# Patient Record
Sex: Female | Born: 2000 | Race: White | Hispanic: No | Marital: Single | Smoking: Current every day smoker
Health system: Southern US, Community
[De-identification: ages and names within clinical notes are randomized; demographics above are authoritative.]

## PROBLEM LIST (undated history)

## (undated) ENCOUNTER — Inpatient Hospital Stay (HOSPITAL_COMMUNITY): Payer: Self-pay

## (undated) DIAGNOSIS — L309 Dermatitis, unspecified: Secondary | ICD-10-CM

## (undated) DIAGNOSIS — J302 Other seasonal allergic rhinitis: Secondary | ICD-10-CM

## (undated) DIAGNOSIS — J45909 Unspecified asthma, uncomplicated: Secondary | ICD-10-CM

## (undated) HISTORY — PX: CHOLECYSTECTOMY: SHX55

## (undated) HISTORY — PX: TONSILLECTOMY: SUR1361

---
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
## 2019-06-07 ENCOUNTER — Telehealth: Payer: Self-pay | Admitting: Obstetrics & Gynecology

## 2019-06-07 NOTE — Telephone Encounter (Signed)
Michele Vasquez referring for female infertility. Called and left voicemail for patient to call back to be schedule

## 2019-06-09 NOTE — Telephone Encounter (Signed)
Called and left voice mail for patient to call back to be schedule °

## 2019-06-13 NOTE — Telephone Encounter (Signed)
Attempted to reach patient to schedule appointment. Called number on file some one picked up when I asked to speak when patient , the person on the line female hung up.

## 2019-09-15 ENCOUNTER — Ambulatory Visit: Payer: No Typology Code available for payment source

## 2019-09-21 ENCOUNTER — Ambulatory Visit: Payer: No Typology Code available for payment source

## 2019-10-16 ENCOUNTER — Encounter: Payer: Self-pay | Admitting: Emergency Medicine

## 2019-10-16 ENCOUNTER — Emergency Department
Admission: EM | Admit: 2019-10-16 | Discharge: 2019-10-16 | Disposition: A | Discharge status: Home / Self Care | Payer: No Typology Code available for payment source | Source: Home / Self Care | Attending: Student | Admitting: Student

## 2019-10-16 ENCOUNTER — Other Ambulatory Visit: Payer: Self-pay

## 2019-10-16 ENCOUNTER — Encounter: Payer: Self-pay | Admitting: *Deleted

## 2019-10-16 ENCOUNTER — Emergency Department: Payer: No Typology Code available for payment source

## 2019-10-16 ENCOUNTER — Emergency Department
Admission: EM | Admit: 2019-10-16 | Discharge: 2019-10-16 | Disposition: A | Discharge status: Home / Self Care | Payer: No Typology Code available for payment source | Attending: Emergency Medicine | Admitting: Emergency Medicine

## 2019-10-16 DIAGNOSIS — R062 Wheezing: Secondary | ICD-10-CM

## 2019-10-16 DIAGNOSIS — F17210 Nicotine dependence, cigarettes, uncomplicated: Secondary | ICD-10-CM | POA: Insufficient documentation

## 2019-10-16 DIAGNOSIS — R05 Cough: Secondary | ICD-10-CM | POA: Insufficient documentation

## 2019-10-16 DIAGNOSIS — Z5321 Procedure and treatment not carried out due to patient leaving prior to being seen by health care provider: Secondary | ICD-10-CM | POA: Diagnosis not present

## 2019-10-16 DIAGNOSIS — Z79899 Other long term (current) drug therapy: Secondary | ICD-10-CM | POA: Insufficient documentation

## 2019-10-16 LAB — CBC WITH DIFFERENTIAL/PLATELET
Abs Immature Granulocytes: 0.04 10*3/uL (ref 0.00–0.07)
Basophils Absolute: 0.1 10*3/uL (ref 0.0–0.1)
Basophils Relative: 0 %
Eosinophils Absolute: 0.7 10*3/uL — ABNORMAL HIGH (ref 0.0–0.5)
Eosinophils Relative: 6 %
HCT: 39.1 % (ref 36.0–46.0)
Hemoglobin: 11.7 g/dL — ABNORMAL LOW (ref 12.0–15.0)
Immature Granulocytes: 0 %
Lymphocytes Relative: 27 %
Lymphs Abs: 3.2 10*3/uL (ref 0.7–4.0)
MCH: 18.8 pg — ABNORMAL LOW (ref 26.0–34.0)
MCHC: 29.9 g/dL — ABNORMAL LOW (ref 30.0–36.0)
MCV: 62.8 fL — ABNORMAL LOW (ref 80.0–100.0)
Monocytes Absolute: 0.8 10*3/uL (ref 0.1–1.0)
Monocytes Relative: 7 %
Neutro Abs: 7 10*3/uL (ref 1.7–7.7)
Neutrophils Relative %: 60 %
Platelets: 284 10*3/uL (ref 150–400)
RBC: 6.23 MIL/uL — ABNORMAL HIGH (ref 3.87–5.11)
RDW: 18.2 % — ABNORMAL HIGH (ref 11.5–15.5)
WBC: 11.8 10*3/uL — ABNORMAL HIGH (ref 4.0–10.5)
nRBC: 0 % (ref 0.0–0.2)

## 2019-10-16 LAB — BASIC METABOLIC PANEL
Anion gap: 10 (ref 5–15)
BUN: 11 mg/dL (ref 6–20)
CO2: 24 mmol/L (ref 22–32)
Calcium: 8.9 mg/dL (ref 8.9–10.3)
Chloride: 106 mmol/L (ref 98–111)
Creatinine, Ser: 0.78 mg/dL (ref 0.44–1.00)
GFR calc Af Amer: >60 mL/min (ref >60)
GFR calc non Af Amer: >60 mL/min (ref >60)
Glucose, Bld: 72 mg/dL (ref 70–99)
Potassium: 4 mmol/L (ref 3.5–5.1)
Sodium: 140 mmol/L (ref 135–145)

## 2019-10-16 MED ORDER — ALBUTEROL SULFATE HFA 108 (90 BASE) MCG/ACT IN AERS: 2.0000 | INHALATION_SPRAY | Freq: Four times a day (QID) | RESPIRATORY_TRACT | 0 refills | Status: DC | PRN

## 2019-10-16 MED ORDER — ALBUTEROL SULFATE (2.5 MG/3ML) 0.083% IN NEBU: 5.0000 mg | INHALATION_SOLUTION | Freq: Once | RESPIRATORY_TRACT | Status: DC

## 2019-10-16 MED ORDER — IPRATROPIUM-ALBUTEROL 0.5-2.5 (3) MG/3ML IN SOLN
3.0000 mL | Freq: Once | RESPIRATORY_TRACT | Status: AC
  Administered 2019-10-16: 3 mL via RESPIRATORY_TRACT
  Filled 2019-10-16: qty 3

## 2019-10-16 MED ORDER — PREDNISONE 50 MG PO TABS: ORAL_TABLET | ORAL | 0 refills | Status: DC

## 2019-10-16 NOTE — ED Triage Notes (Signed)
Pt to ED via POV for wheezing. Pt states that she has been having an issue with her asthma, has been using inhaler and nebulizer more often. Pt states that she came in early this morning but had to leave before being seen. Pt states that she had blood work and a "scan" done. Pt is in NAD.

## 2019-10-16 NOTE — ED Provider Notes (Signed)
Emergency Department Provider Note  ____________________________________________  Time seen: Approximately 5:57 PM  I have reviewed the triage vital signs and the nursing notes.   HISTORY  Chief Complaint Wheezing   Historian Patient     HPI Michele Vasquez is a 19 y.o. female presents to the emergency department with diffuse wheezing for the past 1 to 2 days.  Patient reports a history of asthma but states that she has been out of her albuterol inhaler.  She has experienced some chest tightness and occasional shortness of breath.  She reports nonproductive cough since Thursday.  She denies rhinorrhea, nasal congestion, fever, emesis or diarrhea.  She reports that her current symptoms feel similar to asthma exacerbations in the past.  No other alleviating measures have been attempted.   Past Medical History:  Diagnosis Date  . Asthma   . Eczema   . Seasonal allergies      Immunizations up to date:  Yes.     Past Medical History:  Diagnosis Date  . Asthma   . Eczema   . Seasonal allergies     There are no problems to display for this patient.   Past Surgical History:  Procedure Laterality Date  . CHOLECYSTECTOMY    . TONSILLECTOMY      Prior to Admission medications   Medication Sig Start Date End Date Taking? Authorizing Provider  albuterol (VENTOLIN HFA) 108 (90 Base) MCG/ACT inhaler Inhale 2 puffs into the lungs every 6 (six) hours as needed for wheezing or shortness of breath. 10/16/19   Lannie Fields, PA-C  esomeprazole (NEXIUM) 40 MG capsule Take 1 capsule (40 mg total) by mouth daily. 05/08/15 05/07/16  Nena Polio, MD  loratadine (CLARITIN) 10 MG tablet Take 10 mg by mouth daily.    [provider]  montelukast (SINGULAIR) 10 MG tablet Take 10 mg by mouth at bedtime.    [provider]  predniSONE (DELTASONE) 50 MG tablet Take one tablet daily for the next five days. 10/16/19   Lannie Fields, PA-C     Allergies Patient has no known allergies.  No family history on file.  Social History Social History   Tobacco Use  . Smoking status: Current Every Day Smoker    Packs/day: 1.00    Types: Cigarettes  . Smokeless tobacco: Never Used  Substance Use Topics  . Alcohol use: Yes    Comment: soc  . Drug use: Yes    Types: Marijuana     Review of Systems  Constitutional: No fever/chills Eyes:  No discharge ENT: No upper respiratory complaints. Respiratory: Patient has wheezing. no cough. No SOB/ use of accessory muscles to breath Gastrointestinal:   No nausea, no vomiting.  No diarrhea.  No constipation. Musculoskeletal: Negative for musculoskeletal pain. Skin: Negative for rash, abrasions, lacerations, ecchymosis.   ____________________________________________   PHYSICAL EXAM:  VITAL SIGNS: ED Triage Vitals  Enc Vitals Group     BP 10/16/19 1515 120/79     Pulse Rate 10/16/19 1513 97     Resp 10/16/19 1513 18     Temp 10/16/19 1513 98.3 F (36.8 C)     Temp Source 10/16/19 1513 Oral     SpO2 10/16/19 1513 95 %     Weight --      Height --      Head Circumference --      Peak Flow --      Pain Score 10/16/19 1514 0     Pain Loc --  Pain Edu? --      Excl. in GC? --      Constitutional: Alert and oriented. Well appearing and in no acute distress. Eyes: Conjunctivae are normal. PERRL. EOMI. Head: Atraumatic. ENT:      Ears: TMs are pearly.       Nose: No congestion/rhinnorhea.      Mouth/Throat: Mucous membranes are moist.  Neck: No stridor.  No cervical spine tenderness to palpation.  Cardiovascular: Normal rate, regular rhythm. Normal S1 and S2.  Good peripheral circulation. Respiratory: Normal respiratory effort without tachypnea or retractions.  Patient initially had diffuse wheezing on exam which improved after DuoNeb.  Good air entry to the bases with no decreased or absent breath sounds Gastrointestinal: Bowel sounds x 4 quadrants. Soft and  nontender to palpation. No guarding or rigidity. No distention. Musculoskeletal: Full range of motion to all extremities. No obvious deformities noted Neurologic:  Normal for age. No gross focal neurologic deficits are appreciated.  Skin:  Skin is warm, dry and intact. No rash noted. Psychiatric: Mood and affect are normal for age. Speech and behavior are normal.   ____________________________________________   LABS (all labs ordered are listed, but only abnormal results are displayed)  Labs Reviewed - No data to display ____________________________________________  EKG   ____________________________________________  RADIOLOGY Geraldo Pitter, personally viewed and evaluated these images (plain radiographs) as part of my medical decision making, as well as reviewing the written report by the radiologist.  DG Chest 2 View  Result Date: 10/16/2019 CLINICAL DATA:  Shortness of breath, sore throat, sinus drainage EXAM: CHEST - 2 VIEW COMPARISON:  Radiograph 10/14/2018 FINDINGS: No consolidation, features of edema, pneumothorax, or effusion. Pulmonary vascularity is normally distributed. The cardiomediastinal contours are unremarkable. No acute osseous or soft tissue abnormality. IMPRESSION: No acute cardiopulmonary abnormality. Electronically Signed   By: Kreg Shropshire M.D.   On: 10/16/2019 03:29    ____________________________________________    PROCEDURES  Procedure(s) performed:     Procedures     Medications  ipratropium-albuterol (DUONEB) 0.5-2.5 (3) MG/3ML nebulizer solution 3 mL (3 mLs Nebulization Given 10/16/19 1658)     ____________________________________________   INITIAL IMPRESSION / ASSESSMENT AND PLAN / ED COURSE  Pertinent labs & imaging results that were available during my care of the patient were reviewed by me and considered in my medical decision making (see chart for details).      Assessment and plan Wheezing 19 year old female presents to  the emergency department with diffuse wheezing for the past 1 to 2 days.  Patient was tachypneic but vital signs were otherwise reassuring.  On physical exam, patient had wheezing auscultated bilaterally with good breath sounds in the bases.  Wheezing improved significantly after DuoNeb was administered.  Patient was started on prednisone in the emergency department and she was discharged with both prednisone and albuterol inhaler.  Return precautions were given to return with new or worsening symptoms.  All patient questions were answered.    ____________________________________________  FINAL CLINICAL IMPRESSION(S) / ED DIAGNOSES  Final diagnoses:  Wheezing      NEW MEDICATIONS STARTED DURING THIS VISIT:  ED Discharge Orders         Ordered    albuterol (VENTOLIN HFA) 108 (90 Base) MCG/ACT inhaler  Every 6 hours PRN     10/16/19 1737    predniSONE (DELTASONE) 50 MG tablet     10/16/19 1737              This chart was  dictated using voice recognition software/Dragon. Despite best efforts to proofread, errors can occur which can change the meaning. Any change was purely unintentional.     Orvil Feil, PA-C 10/16/19 1803    Miguel Aschoff., MD 10/17/19 [phone]

## 2019-10-16 NOTE — ED Triage Notes (Signed)
Pt presents w/ c/o shortness of breath and cough. Pt c/o sore throat. Pt has sinus drainage. Pt states albuterol nebs and MDI's are not helping w/ cough and dyspnea.

## 2019-10-16 NOTE — ED Notes (Signed)
Pt completed nebulizer at this time- pt on phone speaking without difficulty. Wheezing decreased from initial assessment.

## 2019-10-16 NOTE — ED Notes (Signed)
Pt c/o asthma for last three days, states she ran out of rescue inhaler and used nebulizer at home- last treatment was around 1 pm without improvement. Pt c/o nasal/chest congestion but denies any sputum production. Pt has inspiratory and expiratory wheezes throughout her lung fields, wet sounding cough noted. Pt aox4, NAD noted.

## 2019-11-25 ENCOUNTER — Ambulatory Visit: Payer: No Typology Code available for payment source

## 2020-01-16 ENCOUNTER — Ambulatory Visit: Payer: No Typology Code available for payment source

## 2020-04-30 ENCOUNTER — Other Ambulatory Visit: Payer: Self-pay

## 2020-04-30 ENCOUNTER — Encounter (HOSPITAL_COMMUNITY): Payer: Self-pay | Admitting: Emergency Medicine

## 2020-04-30 ENCOUNTER — Ambulatory Visit (HOSPITAL_COMMUNITY)
Admission: EM | Admit: 2020-04-30 | Discharge: 2020-04-30 | Disposition: A | Discharge status: Home / Self Care | Payer: Self-pay | Attending: Emergency Medicine | Admitting: Emergency Medicine

## 2020-04-30 DIAGNOSIS — Z3202 Encounter for pregnancy test, result negative: Secondary | ICD-10-CM

## 2020-04-30 DIAGNOSIS — Z8249 Family history of ischemic heart disease and other diseases of the circulatory system: Secondary | ICD-10-CM | POA: Insufficient documentation

## 2020-04-30 DIAGNOSIS — R197 Diarrhea, unspecified: Secondary | ICD-10-CM

## 2020-04-30 DIAGNOSIS — R112 Nausea with vomiting, unspecified: Secondary | ICD-10-CM | POA: Insufficient documentation

## 2020-04-30 DIAGNOSIS — R1110 Vomiting, unspecified: Secondary | ICD-10-CM

## 2020-04-30 DIAGNOSIS — J45909 Unspecified asthma, uncomplicated: Secondary | ICD-10-CM | POA: Insufficient documentation

## 2020-04-30 DIAGNOSIS — Z20822 Contact with and (suspected) exposure to covid-19: Secondary | ICD-10-CM | POA: Insufficient documentation

## 2020-04-30 DIAGNOSIS — Z79899 Other long term (current) drug therapy: Secondary | ICD-10-CM | POA: Insufficient documentation

## 2020-04-30 DIAGNOSIS — R1013 Epigastric pain: Secondary | ICD-10-CM | POA: Insufficient documentation

## 2020-04-30 DIAGNOSIS — F17210 Nicotine dependence, cigarettes, uncomplicated: Secondary | ICD-10-CM | POA: Insufficient documentation

## 2020-04-30 LAB — CBC
HCT: 39.6 % (ref 36.0–46.0)
Hemoglobin: 11.8 g/dL — ABNORMAL LOW (ref 12.0–15.0)
MCH: 18.7 pg — ABNORMAL LOW (ref 26.0–34.0)
MCHC: 29.8 g/dL — ABNORMAL LOW (ref 30.0–36.0)
MCV: 62.8 fL — ABNORMAL LOW (ref 80.0–100.0)
Platelets: 267 10*3/uL (ref 150–400)
RBC: 6.31 MIL/uL — ABNORMAL HIGH (ref 3.87–5.11)
RDW: 18.4 % — ABNORMAL HIGH (ref 11.5–15.5)
WBC: 11.4 10*3/uL — ABNORMAL HIGH (ref 4.0–10.5)
nRBC: 0 % (ref 0.0–0.2)

## 2020-04-30 LAB — COMPREHENSIVE METABOLIC PANEL
ALT: 12 U/L (ref 0–44)
AST: 15 U/L (ref 15–41)
Albumin: 4.2 g/dL (ref 3.5–5.0)
Alkaline Phosphatase: 54 U/L (ref 38–126)
Anion gap: 10 (ref 5–15)
BUN: 12 mg/dL (ref 6–20)
CO2: 22 mmol/L (ref 22–32)
Calcium: 9 mg/dL (ref 8.9–10.3)
Chloride: 107 mmol/L (ref 98–111)
Creatinine, Ser: 0.71 mg/dL (ref 0.44–1.00)
GFR calc Af Amer: >60 mL/min (ref >60)
GFR calc non Af Amer: >60 mL/min (ref >60)
Glucose, Bld: 85 mg/dL (ref 70–99)
Potassium: 4.1 mmol/L (ref 3.5–5.1)
Sodium: 139 mmol/L (ref 135–145)
Total Bilirubin: 0.6 mg/dL (ref 0.3–1.2)
Total Protein: 7.2 g/dL (ref 6.5–8.1)

## 2020-04-30 LAB — POCT URINALYSIS DIPSTICK, ED / UC
Bilirubin Urine: NEGATIVE
Glucose, UA: NEGATIVE mg/dL
Leukocytes,Ua: NEGATIVE
Nitrite: NEGATIVE
Protein, ur: NEGATIVE mg/dL
Specific Gravity, Urine: 1.025 (ref 1.005–1.030)
Urobilinogen, UA: 0.2 mg/dL (ref 0.0–1.0)
pH: 5 (ref 5.0–8.0)

## 2020-04-30 LAB — LIPASE, BLOOD: Lipase: 27 U/L (ref 11–51)

## 2020-04-30 LAB — POC URINE PREG, ED: Preg Test, Ur: NEGATIVE

## 2020-04-30 LAB — SARS CORONAVIRUS 2 (TAT 6-24 HRS): SARS Coronavirus 2: NEGATIVE

## 2020-04-30 MED ORDER — OMEPRAZOLE 20 MG PO CPDR: 20.0000 mg | DELAYED_RELEASE_CAPSULE | Freq: Two times a day (BID) | ORAL | 0 refills | Status: DC

## 2020-04-30 MED ORDER — ONDANSETRON 4 MG PO TBDP: 4.0000 mg | ORAL_TABLET | Freq: Three times a day (TID) | ORAL | 0 refills | Status: DC | PRN

## 2020-04-30 MED ORDER — FAMOTIDINE 20 MG PO TABS: 20.0000 mg | ORAL_TABLET | Freq: Two times a day (BID) | ORAL | 0 refills | Status: DC

## 2020-04-30 MED ORDER — DICYCLOMINE HCL 20 MG PO TABS: 20.0000 mg | ORAL_TABLET | Freq: Three times a day (TID) | ORAL | 0 refills | Status: DC

## 2020-04-30 NOTE — ED Triage Notes (Signed)
Pt c/o emesis and diarrhea x 1 week. She states she had her gallbladder removed in 2015. She states she is vomiting yellow or clear but no food. She states this is how she felt before she had her gallbladder removed.

## 2020-04-30 NOTE — Discharge Instructions (Addendum)
Urine normal, without signs of infection Blood work pending-I will only call if this is abnormal Begin omeprazole twice daily for the next 2 weeks Pepcid twice daily as needed for more immediate relief Zofran as needed for nausea Bentyl before meals and bedtime as needed for cramping Drink plenty of fluids Please follow-up if any symptoms not improving or worsening

## 2020-04-30 NOTE — ED Provider Notes (Signed)
[facility]    CSN: [id number] Arrival date & time: 04/30/20  2947      History   Chief Complaint Chief Complaint  Patient presents with  . Emesis  . Diarrhea    HPI Michele Vasquez is a 19 y.o. female history of prior cholecystectomy presenting today for evaluation of abdominal pain and vomiting.  Patient reports over the past week she has had nausea and vomiting especially in the morning.  She reports a burning sensation to her upper abdomen.  After vomiting she notices a cramping sensation in her lower abdomen.  Vomiting has been clear/mucousy.  She has had associated headaches.  Diarrhea has been present over the past 3 days.  She started her menstrual cycle 3 days ago ago as well.  She is not on any birth control.  Denies significant alcohol use.  Denies NSAID use, does occasionally take Tylenol PM.  Denies other GI problems.  Denies close sick contacts.  Denies URI symptoms.    HPI  Past Medical History:  Diagnosis Date  . Asthma   . Eczema   . Seasonal allergies     There are no problems to display for this patient.   Past Surgical History:  Procedure Laterality Date  . CHOLECYSTECTOMY    . TONSILLECTOMY      OB History   No obstetric history on file.      Home Medications    Prior to Admission medications   Medication Sig Start Date End Date Taking? Authorizing Provider  albuterol (VENTOLIN HFA) 108 (90 Base) MCG/ACT inhaler Inhale 2 puffs into the lungs every 6 (six) hours as needed for wheezing or shortness of breath. 10/16/19   Orvil Feil, PA-C  dicyclomine (BENTYL) 20 MG tablet Take 1 tablet (20 mg total) by mouth 4 (four) times daily -  before meals and at bedtime. 04/30/20   Marcus Groll C, PA-C  famotidine (PEPCID) 20 MG tablet Take 1 tablet (20 mg total) by mouth 2 (two) times daily. 04/30/20   Anatasia Tino C, PA-C  omeprazole (PRILOSEC) 20 MG capsule Take 1 capsule (20 mg total) by mouth 2 (two) times daily before a  meal for 15 days. 04/30/20 05/15/20  Divon Krabill C, PA-C  ondansetron (ZOFRAN ODT) 4 MG disintegrating tablet Take 1 tablet (4 mg total) by mouth every 8 (eight) hours as needed for nausea or vomiting. 04/30/20   Arraya Buck C, PA-C  esomeprazole (NEXIUM) 40 MG capsule Take 1 capsule (40 mg total) by mouth daily. 05/08/15 04/30/20  Arnaldo Natal, MD  loratadine (CLARITIN) 10 MG tablet Take 10 mg by mouth daily.  04/30/20  [provider]  montelukast (SINGULAIR) 10 MG tablet Take 10 mg by mouth at bedtime.  04/30/20  [provider]    Family History Family History  Problem Relation Age of Onset  . Hypertension Mother   . Migraines Mother   . Asthma Father   . Heart disease Father   . Hypertension Father   . Breast cancer Maternal Grandmother   . Colon cancer Maternal Grandmother   . Stomach cancer Maternal Grandmother   . Diabetes Maternal Grandmother     Social History Social History   Tobacco Use  . Smoking status: Current Every Day Smoker    Packs/day: 1.00    Types: Cigarettes  . Smokeless tobacco: Never Used  Substance Use Topics  . Alcohol use: Yes    Comment: soc  . Drug use: Yes  Types: Marijuana     Allergies   Patient has no known allergies.   Review of Systems Review of Systems  Constitutional: Negative for activity change, appetite change, chills, fatigue and fever.  HENT: Negative for congestion, ear pain, rhinorrhea, sinus pressure, sore throat and trouble swallowing.   Eyes: Negative for discharge and redness.  Respiratory: Negative for cough, chest tightness and shortness of breath.   Cardiovascular: Negative for chest pain.  Gastrointestinal: Positive for diarrhea, nausea and vomiting. Negative for abdominal pain.  Genitourinary: Negative for dysuria, frequency and urgency.  Musculoskeletal: Negative for myalgias.  Skin: Negative for rash.  Neurological: Negative for dizziness, light-headedness and headaches.      Physical Exam Triage Vital Signs ED Triage Vitals  Enc Vitals Group     BP      Pulse      Resp      Temp      Temp src      SpO2      Weight      Height      Head Circumference      Peak Flow      Pain Score      Pain Loc      Pain Edu?      Excl. in GC?    No data found.  Updated Vital Signs BP 113/68 (BP Location: Left Arm)   Pulse 74   Temp 98.7 F (37.1 C) (Oral)   Resp 16   LMP 04/28/2020   SpO2 100%   Visual Acuity Right Eye Distance:   Left Eye Distance:   Bilateral Distance:    Right Eye Near:   Left Eye Near:    Bilateral Near:     Physical Exam Vitals and nursing note reviewed.  Constitutional:      Appearance: She is well-developed.     Comments: No acute distress  HENT:     Head: Normocephalic and atraumatic.     Ears:     Comments: Bilateral ears without tenderness to palpation of external auricle, tragus and mastoid, EAC's without erythema or swelling, TM's with good bony landmarks and cone of light. Non erythematous.     Nose: Nose normal.     Mouth/Throat:     Comments: Oral mucosa pink and moist, no tonsillar enlargement or exudate. Posterior pharynx patent and nonerythematous, no uvula deviation or swelling. Normal phonation. Eyes:     Conjunctiva/sclera: Conjunctivae normal.  Cardiovascular:     Rate and Rhythm: Normal rate.  Pulmonary:     Effort: Pulmonary effort is normal. No respiratory distress.     Comments: Breathing comfortably at rest, CTABL, no wheezing, rales or other adventitious sounds auscultated Abdominal:     General: There is no distension.     Comments: Mild tenderness to right upper quadrant and epigastrium, bilateral lower quadrants without tenderness negative rebound, negative Rovsing, negative McBurney's  Musculoskeletal:        General: Normal range of motion.     Cervical back: Neck supple.  Skin:    General: Skin is warm and dry.  Neurological:     Mental Status: She is alert and oriented to  person, place, and time.      UC Treatments / Results  Labs (all labs ordered are listed, but only abnormal results are displayed) Labs Reviewed  POCT URINALYSIS DIPSTICK, ED / UC - Abnormal; Notable for the following components:      Result Value   Ketones, ur TRACE (*)  Hgb urine dipstick LARGE (*)    All other components within normal limits  SARS CORONAVIRUS 2 (TAT 6-24 HRS)  CBC  COMPREHENSIVE METABOLIC PANEL  LIPASE, BLOOD  POC URINE PREG, ED    EKG   Radiology No results found.  Procedures Procedures (including critical care time)  Medications Ordered in UC Medications - No data to display  Initial Impression / Assessment and Plan / UC Course  I have reviewed the triage vital signs and the nursing notes.  Pertinent labs & imaging results that were available during my care of the patient were reviewed by me and considered in my medical decision making (see chart for details).    COVID PCR pending Pregnancy test negative, UA not suggestive of UTI, symptoms and history my suggestive of likely underlying gastritis/GERD, has had prior cholecystectomy.  Initiating on PPI x2 weeks, Pepcid to supplement as needed, Zofran for nausea, Bentyl for cramping.  If cramping related to menstrual cycle may use NSAIDs.  Blood work pending of CBC, CMP and lipase, will call if abnormal/altering plan.  Discussed strict return precautions. Patient verbalized understanding and is agreeable with plan.  Final Clinical Impressions(s) / UC Diagnoses   Final diagnoses:  Non-intractable vomiting with nausea, unspecified vomiting type  Epigastric pain     Discharge Instructions     Urine normal, without signs of infection Blood work pending-I will only call if this is abnormal Begin omeprazole twice daily for the next 2 weeks Pepcid twice daily as needed for more immediate relief Zofran as needed for nausea Bentyl before meals and bedtime as needed for cramping Drink plenty of  fluids Please follow-up if any symptoms not improving or worsening    ED Prescriptions    Medication Sig Dispense Auth. Provider   omeprazole (PRILOSEC) 20 MG capsule Take 1 capsule (20 mg total) by mouth 2 (two) times daily before a meal for 15 days. 30 capsule Adhya Cocco C, PA-C   ondansetron (ZOFRAN ODT) 4 MG disintegrating tablet Take 1 tablet (4 mg total) by mouth every 8 (eight) hours as needed for nausea or vomiting. 20 tablet Britania Shreeve C, PA-C   famotidine (PEPCID) 20 MG tablet Take 1 tablet (20 mg total) by mouth 2 (two) times daily. 30 tablet Atticus Lemberger C, PA-C   dicyclomine (BENTYL) 20 MG tablet Take 1 tablet (20 mg total) by mouth 4 (four) times daily -  before meals and at bedtime. 20 tablet Nyoka Alcoser, Schulter C, PA-C     PDMP not reviewed this encounter.   Cordelle Dahmen, [place] C, PA-C 04/30/20 0930

## 2020-06-05 ENCOUNTER — Encounter (HOSPITAL_COMMUNITY): Payer: Self-pay | Admitting: Emergency Medicine

## 2020-06-05 ENCOUNTER — Other Ambulatory Visit: Payer: Self-pay

## 2020-06-05 ENCOUNTER — Ambulatory Visit (HOSPITAL_COMMUNITY)
Admission: EM | Admit: 2020-06-05 | Discharge: 2020-06-05 | Disposition: A | Discharge status: Home / Self Care | Payer: Self-pay | Attending: Family Medicine | Admitting: Family Medicine

## 2020-06-05 DIAGNOSIS — N390 Urinary tract infection, site not specified: Secondary | ICD-10-CM

## 2020-06-05 DIAGNOSIS — R300 Dysuria: Secondary | ICD-10-CM

## 2020-06-05 LAB — POCT URINALYSIS DIPSTICK, ED / UC
Bilirubin Urine: NEGATIVE
Glucose, UA: NEGATIVE mg/dL
Hgb urine dipstick: NEGATIVE
Ketones, ur: NEGATIVE mg/dL
Leukocytes,Ua: NEGATIVE
Nitrite: NEGATIVE
Protein, ur: NEGATIVE mg/dL
Specific Gravity, Urine: 1.025 (ref 1.005–1.030)
Urobilinogen, UA: 0.2 mg/dL (ref 0.0–1.0)
pH: 7 (ref 5.0–8.0)

## 2020-06-05 NOTE — Discharge Instructions (Addendum)
We have sent testing for sexually transmitted infections. We will notify you of any positive results once they are received. If required, we will prescribe any medications you might need.  Please refrain from all sexual activity for at least the next seven days.  

## 2020-06-05 NOTE — ED Triage Notes (Signed)
Reports burning with urination and dark urine for 4 days.  Has been taking azo.  Patient reports her partner is positive for chlamydia , gonorrhea.  Patient requesting testing

## 2020-06-06 NOTE — ED Provider Notes (Signed)
[facility]   [id number] 06/05/20 Arrival Time: 1509  ASSESSMENT & PLAN:  1. Dysuria       Discharge Instructions     We have sent testing for sexually transmitted infections. We will notify you of any positive results once they are received. If required, we will prescribe any medications you might need.  Please refrain from all sexual activity for at least the next seven days.     Without s/s of PID.  Labs Reviewed  POCT URINALYSIS DIPSTICK, ED / UC  CERVICOVAGINAL ANCILLARY ONLY    Will notify of any positive results. Instructed to refrain from sexual activity for at least seven days.  Reviewed expectations re: course of current medical issues. Questions answered. Outlined signs and symptoms indicating need for more acute intervention. Patient verbalized understanding. After Visit Summary given.   SUBJECTIVE:  Michele Vasquez is a 19 y.o. female who requests STD screening. No symptoms. Treated for chlamydia and gonorrhea in the near past. Feeling well. One female sexual partner.  Patient's last menstrual period was 05/28/2020.   OBJECTIVE:  Vitals:   06/05/20 1559  BP: 108/77  Pulse: 94  Resp: 18  Temp: 98 F (36.7 C)  TempSrc: Oral  SpO2: 96%     General appearance: alert, cooperative, appears stated age and no distress Lungs: unlabored respirations; speaks full sentences without difficulty Back: no CVA tenderness; FROM at waist Abdomen: soft, non-tender GU: deferred Skin: warm and dry Psychological: alert and cooperative; normal mood and affect.  Results for orders placed or performed during the hospital encounter of 06/05/20  POC Urinalysis dipstick  Result Value Ref Range   Glucose, UA NEGATIVE NEGATIVE mg/dL   Bilirubin Urine NEGATIVE NEGATIVE   Ketones, ur NEGATIVE NEGATIVE mg/dL   Specific Gravity, Urine 1.025 1.005 - 1.030   Hgb urine dipstick NEGATIVE NEGATIVE   pH 7.0 5.0 - 8.0   Protein, ur NEGATIVE NEGATIVE  mg/dL   Urobilinogen, UA 0.2 0.0 - 1.0 mg/dL   Nitrite NEGATIVE NEGATIVE   Leukocytes,Ua NEGATIVE NEGATIVE    Labs Reviewed  POCT URINALYSIS DIPSTICK, ED / UC  CERVICOVAGINAL ANCILLARY ONLY    No Known Allergies  Past Medical History:  Diagnosis Date  . Asthma   . Eczema   . Seasonal allergies    Family History  Problem Relation Age of Onset  . Hypertension Mother   . Migraines Mother   . Asthma Father   . Heart disease Father   . Hypertension Father   . Breast cancer Maternal Grandmother   . Colon cancer Maternal Grandmother   . Stomach cancer Maternal Grandmother   . Diabetes Maternal Grandmother    Social History   Socioeconomic History  . Marital status: Single    Spouse name: Not on file  . Number of children: Not on file  . Years of education: Not on file  . Highest education level: Not on file  Occupational History  . Not on file  Tobacco Use  . Smoking status: Current Every Day Smoker    Packs/day: 1.00    Types: Cigarettes  . Smokeless tobacco: Never Used  Substance and Sexual Activity  . Alcohol use: Yes    Comment: soc  . Drug use: Yes    Types: Marijuana  . Sexual activity: Not on file  Other Topics Concern  . Not on file  Social History Narrative  . Not on file   Social Determinants of Health   Financial Resource Strain:   .  Difficulty of Paying Living Expenses: Not on file  Food Insecurity:   . Worried About Programme researcher, broadcasting/film/video in the Last Year: Not on file  . Ran Out of Food in the Last Year: Not on file  Transportation Needs:   . Lack of Transportation (Medical): Not on file  . Lack of Transportation (Non-Medical): Not on file  Physical Activity:   . Days of Exercise per Week: Not on file  . Minutes of Exercise per Session: Not on file  Stress:   . Feeling of Stress : Not on file  Social Connections:   . Frequency of Communication with Friends and Family: Not on file  . Frequency of Social Gatherings with Friends and Family:  Not on file  . Attends Religious Services: Not on file  . Active Member of Clubs or Organizations: Not on file  . Attends Banker Meetings: Not on file  . Marital Status: Not on file  Intimate Partner Violence:   . Fear of Current or Ex-Partner: Not on file  . Emotionally Abused: Not on file  . Physically Abused: Not on file  . Sexually Abused: Not on file          Mardella Layman, MD 06/06/20 1416

## 2020-06-08 ENCOUNTER — Telehealth (HOSPITAL_COMMUNITY): Payer: Self-pay | Admitting: Emergency Medicine

## 2020-06-08 LAB — CERVICOVAGINAL ANCILLARY ONLY
Bacterial Vaginitis (gardnerella): POSITIVE — AB
Chlamydia: NEGATIVE
Comment: NEGATIVE
Comment: NEGATIVE
Comment: NEGATIVE
Comment: NORMAL
Neisseria Gonorrhea: NEGATIVE
Trichomonas: NEGATIVE

## 2020-06-08 MED ORDER — METRONIDAZOLE 500 MG PO TABS: 500.0000 mg | ORAL_TABLET | Freq: Two times a day (BID) | ORAL | 0 refills | Status: DC

## 2020-10-02 ENCOUNTER — Telehealth: Payer: Medicaid Other | Admitting: Physician Assistant

## 2020-10-02 ENCOUNTER — Ambulatory Visit: Payer: Self-pay

## 2020-10-02 DIAGNOSIS — J45901 Unspecified asthma with (acute) exacerbation: Secondary | ICD-10-CM

## 2020-10-02 MED ORDER — PREDNISONE 50 MG PO TABS: 50.0000 mg | ORAL_TABLET | Freq: Every day | ORAL | 0 refills | Status: AC

## 2020-10-02 NOTE — Progress Notes (Signed)
Virtual Visit via Video Note  Michele Vasquez,you are scheduled for a virtual visit with your provider today.    Just as we do with appointments in the office, we must obtain your consent to participate.  Your consent will be active for this visit and any virtual visit you may have with one of our providers in the next 365 days.    If you have a MyChart account, I can also send a copy of this consent to you electronically.  All virtual visits are billed to your insurance company just like a traditional visit in the office.  As this is a virtual visit, video technology does not allow for your provider to perform a traditional examination.  This may limit your provider's ability to fully assess your condition.  If your provider identifies any concerns that need to be evaluated in person or the need to arrange testing such as labs, EKG, etc, we will make arrangements to do so.    Although advances in technology are sophisticated, we cannot ensure that it will always work on either your end or our end.  If the connection with a video visit is poor, we may have to switch to a telephone visit.  With either a video or telephone visit, we are not always able to ensure that we have a secure connection.   I need to obtain your verbal consent now.   Are you willing to proceed with your visit today?   Michele Vasquez has provided verbal consent on 10/02/2020 for a virtual visit (video or telephone).   Michele Vasquez Manfred Shirts, PA-C 10/02/2020  4:58 PM     I connected with Michele Vasquez on 10/02/20 at  4:30 PM EST by a video enabled telemedicine application and verified that I am speaking with the correct person using two identifiers.  Location: Patient: home Provider: home   I discussed the limitations of evaluation and management by telemedicine and the availability of in person appointments. The patient expressed understanding and agreed to proceed.  History of Present Illness:  Pt is a 20  y/o F with a h/o asthma who presents ti the ED today for evaluation of an asthma exacerbation. She states that since this morning she has been feeling short of breath with chest tightness. She has had many exacerbations in that past and this feels similar. Sxs usually flair up when the weather changes. She usually takes advair, claritin and montelukast however she has not been able to afford these medications due to her insurance and has only been taking her albuterol inhaler. Her inhaler has helped some and has allowed her to do her daily activities however she feels that she needs a prescription for steroids. She has been admitted for asthma in the past but states her symptoms today are not as severe as when she has required admission in the past. She denies fevers, rhinorrhea, congestion, sore throat. States she had COVID last year and is also fully vaccinated.   ROS:  As noted in HPI.   Observations/Objective:  Physical Exam Constitutional:      General: She is not in acute distress. HENT:     Head: Atraumatic.  Pulmonary:     Comments: Speaking in full sentences without obvious tachypnea. Ambulatory without obvious dyspnea during visit. No auditory wheezing or accessory muscle use. Musculoskeletal:     Cervical back: Neck supple.  Neurological:     Mental Status: She is alert.  Psychiatric:  Mood and Affect: Mood normal.      Assessment and Plan:  1. Likely has asthma exacerbation due to not filling her home medications. Does not appear to be in any acute distress on video evaluation. Given hx of admissions for asthma exacerbations I recommended patient be seen in person likely in the ED, however will also give Rx for prednisone as she is reticent to go to the ER at this time. She has an appointment later today to be seen at urgent care. She is agreeable to go to the ER if her symptoms worsen  Follow Up Instructions:   Advised pt to keep f/u appt today and to be see in the ED  if sxs worsen or persist. I discussed the assessment and treatment plan with the patient. The patient was provided an opportunity to ask questions and all were answered. The patient agreed with the plan and demonstrated an understanding of the instructions.   The patient was advised to call back or seek an in-person evaluation if the symptoms worsen or if the condition fails to improve as anticipated.  I provided 10 minutes of non-face-to-face time during this encounter.   Michele Vasquez Manfred Shirts, PA-C

## 2020-10-02 NOTE — Progress Notes (Signed)
Based on what you shared with me, I feel your condition warrants further evaluation and I recommend that you be seen for a face to face office visit.  I am the provider who just completed a video visit with you. As discussed, I feel that you will need to be seen in person to check your oxygen levels and have a physical exam. Since you have already completed a video visit you will not be charged for this visit as well.    NOTE: If you entered your credit card information for this eVisit, you will not be charged. You may see a "hold" on your card for the $35 but that hold will drop off and you will not have a charge processed.   If you are having a true medical emergency please call 911.      For an urgent face to face visit, [place] has five urgent care centers for your convenience:     [facility] at [facility] Directions [phone] [address] [place], [place] [address] . 10 am - 6pm Monday - Friday    [facility]) Get Driving Directions [phone] [address] [place], [place] [address] . 10 am to 8 pm Monday-Friday . 12 pm to 8 pm [facility] at [facility] Get Driving Directions [phone] [address], [address] [place], [place] [address] . 8 am to 8 pm Monday-Friday . 9 am to 6 pm Saturday . 11 am to 6 pm Sunday     [facility] at [facility] Get Driving Directions  [phone] [address] [place], [place] [address] . 8 am to 8 pm Monday-Friday . 8 am to 4 pm [facility] Urgent Care at [facility] Directions [phone] [address] [place], [place] [address] . 12 pm to 6 pm Monday-Friday      Your e-visit answers were reviewed by a board certified advanced clinical practitioner to complete your personal care plan.  Thank you for using e-Visits.    Approximately 5 minutes was spent documenting and reviewing patient's chart.

## 2020-10-02 NOTE — Patient Instructions (Signed)
http://www.aaaai.org/conditions-and-treatments/asthma">  Asthma, Adult  Asthma is a long-term (chronic) condition that causes recurrent episodes in which the airways become tight and narrow. The airways are the passages that lead from the nose and mouth down into the lungs. Asthma episodes, also called asthma attacks, can cause coughing, wheezing, shortness of breath, and chest pain. The airways can also fill with mucus. During an attack, it can be difficult to breathe. Asthma attacks can range from minor to life threatening. Asthma cannot be cured, but medicines and lifestyle changes can help control it and treat acute attacks. What are the causes? This condition is believed to be caused by inherited (genetic) and environmental factors, but its exact cause is not known. There are many things that can bring on an asthma attack or make asthma symptoms worse (triggers). Asthma triggers are different for each person. Common triggers include:  Mold.  Dust.  Cigarette smoke.  Cockroaches.  Things that can cause allergy symptoms (allergens), such as animal dander or pollen from trees or grass.  Air pollutants such as household cleaners, wood smoke, smog, or chemical odors.  Cold air, weather changes, and winds (which increase molds and pollen in the air).  Strong emotional expressions such as crying or laughing hard.  Stress.  Certain medicines (such as aspirin) or types of medicines (such as beta-blockers).  Sulfites in foods and drinks. Foods and drinks that may contain sulfites include dried fruit, potato chips, and sparkling grape juice.  Infections or inflammatory conditions such as the flu, a cold, or inflammation of the nasal membranes (rhinitis).  Gastroesophageal reflux disease (GERD).  Exercise or strenuous activity. What are the signs or symptoms? Symptoms of this condition may occur right after asthma is triggered or many hours later. Symptoms include:  Wheezing. This can  sound like whistling when you breathe.  Excessive nighttime or early morning coughing.  Frequent or severe coughing with a common cold.  Chest tightness.  Shortness of breath.  Tiredness (fatigue) with minimal activity. How is this diagnosed? This condition is diagnosed based on:  Your medical history.  A physical exam.  Tests, which may include: ? Lung function studies and pulmonary studies (spirometry). These tests can evaluate the flow of air in your lungs. ? Allergy tests. ? Imaging tests, such as X-rays. How is this treated? There is no cure for this condition, but treatment can help control your symptoms. Treatment for asthma usually involves:  Identifying and avoiding your asthma triggers.  Using medicines to control your symptoms. Generally, two types of medicines are used to treat asthma: ? Controller medicines. These help prevent asthma symptoms from occurring. They are usually taken every day. ? Fast-acting reliever or rescue medicines. These quickly relieve asthma symptoms by widening the narrow and tight airways. They are used as needed and provide short-term relief.  Using supplemental oxygen. This may be needed during a severe episode.  Using other medicines, such as: ? Allergy medicines, such as antihistamines, if your asthma attacks are triggered by allergens. ? Immune medicines (immunomodulators). These are medicines that help control the immune system.  Creating an asthma action plan. An asthma action plan is a written plan for managing and treating your asthma attacks. This plan includes: ? A list of your asthma triggers and how to avoid them. ? Information about when medicines should be taken and when their dosage should be changed. ? Instructions about using a device called a peak flow meter. A peak flow meter measures how well the lungs are working   and the severity of your asthma. It helps you monitor your condition. Follow these instructions at  home: Controlling your home environment Control your home environment in the following ways to help avoid triggers and prevent asthma attacks:  Change your heating and air conditioning filter regularly.  Limit your use of fireplaces and wood stoves.  Get rid of pests (such as roaches and mice) and their droppings.  Throw away plants if you see mold on them.  Clean floors and dust surfaces regularly. Use unscented cleaning products.  Try to have someone else vacuum for you regularly. Stay out of rooms while they are being vacuumed and for a short while afterward. If you vacuum, use a dust mask from a hardware store, a double-layered or microfilter vacuum cleaner bag, or a vacuum cleaner with a HEPA filter.  Replace carpet with wood, tile, or vinyl flooring. Carpet can trap dander and dust.  Use allergy-proof pillows, mattress covers, and box spring covers.  Keep your bedroom a trigger-free room.  Avoid pets and keep windows closed when allergens are in the air.  Wash beddings every week in hot water and dry them in a dryer.  Use blankets that are made of polyester or cotton.  Clean bathrooms and kitchens with bleach. If possible, have someone repaint the walls in these rooms with mold-resistant paint. Stay out of the rooms that are being cleaned and painted.  Wash your hands often with soap and water. If soap and water are not available, use hand sanitizer.  Do not allow anyone to smoke in your home. General instructions  Take over-the-counter and prescription medicines only as told by your health care provider. ? Speak with your health care provider if you have questions about how or when to take the medicines. ? Make note if you are requiring more frequent dosages.  Do not use any products that contain nicotine or tobacco, such as cigarettes and e-cigarettes. If you need help quitting, ask your health care provider. Also, avoid being exposed to secondhand smoke.  Use a peak  flow meter as told by your health care provider. Record and keep track of the readings.  Understand and use the asthma action plan to help minimize, or stop an asthma attack, without needing to seek medical care.  Make sure you stay up to date on your yearly vaccinations as told by your health care provider. This may include vaccines for the flu and pneumonia.  Avoid outdoor activities when allergen counts are high and when air quality is low.  Wear a ski mask that covers your nose and mouth during outdoor winter activities. Exercise indoors on cold days if you can.  Warm up before exercising, and take time for a cool-down period after exercise.  Keep all follow-up visits as told by your health care provider. This is important. Where to find more information  For information about asthma, turn to the Centers for Disease Control and Prevention at www.cdc.gov/asthma/faqs  For air quality information, turn to AirNow at airnow.gov Contact a health care provider if:  You have wheezing, shortness of breath, or a cough even while you are taking medicine to prevent attacks.  The mucus you cough up (sputum) is thicker than usual.  Your sputum changes from clear or white to yellow, green, gray, or bloody.  Your medicines are causing side effects, such as a rash, itching, swelling, or trouble breathing.  You need to use a reliever medicine more than 2-3 times a week.  Your peak   flow reading is still at 50-79% of your personal best after following your action plan for 1 hour.  You have a fever. Get help right away if:  You are getting worse and do not respond to treatment during an asthma attack.  You are short of breath when at rest or when doing very little physical activity.  You have difficulty eating, drinking, or talking.  You have chest pain or tightness.  You develop a fast heartbeat or palpitations.  You have a bluish color to your lips or fingernails.  You are  light-headed or dizzy, or you faint.  Your peak flow reading is less than 50% of your personal best.  You feel too tired to breathe normally. Summary  Asthma is a long-term (chronic) condition that causes recurrent episodes in which the airways become tight and narrow. These episodes can cause coughing, wheezing, shortness of breath, and chest pain.  Asthma cannot be cured, but medicines and lifestyle changes can help control it and treat acute attacks.  Make sure you understand how to avoid triggers and how and when to use your medicines.  Asthma attacks can range from minor to life threatening. Get help right away if you have an asthma attack and do not respond to treatment with your usual rescue medicines. This information is not intended to replace advice given to you by your health care provider. Make sure you discuss any questions you have with your health care provider. Document Revised: 04/20/2020 Document Reviewed: 11/23/2019 Elsevier Patient Education  2021 [employer].  

## 2020-10-17 ENCOUNTER — Encounter (HOSPITAL_COMMUNITY): Payer: Self-pay

## 2020-10-17 ENCOUNTER — Ambulatory Visit (HOSPITAL_COMMUNITY)
Admission: EM | Admit: 2020-10-17 | Discharge: 2020-10-17 | Disposition: A | Discharge status: Home / Self Care | Payer: Self-pay | Attending: Medical Oncology | Admitting: Medical Oncology

## 2020-10-17 ENCOUNTER — Other Ambulatory Visit: Payer: Self-pay

## 2020-10-17 ENCOUNTER — Ambulatory Visit (INDEPENDENT_AMBULATORY_CARE_PROVIDER_SITE_OTHER): Payer: Self-pay

## 2020-10-17 DIAGNOSIS — R062 Wheezing: Secondary | ICD-10-CM

## 2020-10-17 DIAGNOSIS — R059 Cough, unspecified: Secondary | ICD-10-CM

## 2020-10-17 MED ORDER — ALBUTEROL SULFATE HFA 108 (90 BASE) MCG/ACT IN AERS: 1.0000 | INHALATION_SPRAY | Freq: Four times a day (QID) | RESPIRATORY_TRACT | 0 refills | Status: DC | PRN

## 2020-10-17 MED ORDER — LORATADINE 10 MG PO TABS: 10.0000 mg | ORAL_TABLET | Freq: Every day | ORAL | 0 refills | Status: AC

## 2020-10-17 MED ORDER — MONTELUKAST SODIUM 10 MG PO TABS: 10.0000 mg | ORAL_TABLET | Freq: Every day | ORAL | 0 refills | Status: DC

## 2020-10-17 MED ORDER — FLUTICASONE PROPIONATE 50 MCG/ACT NA SUSP: 2.0000 | Freq: Every day | NASAL | 0 refills | Status: DC

## 2020-10-17 NOTE — ED Provider Notes (Signed)
[facility]    CSN: [id number] Arrival date & time: 10/17/20  1027      History   Chief Complaint Chief Complaint  Patient presents with  . Cough  . Dizziness  . Fatigue    HPI Michele Vasquez is a 20 y.o. female.   HPI   Cough: Patient states that for the past week she has had a productive cough, cough, wheezing and headache.  She states that she is getting cold symptoms off and on for the past few months.  She worries that there may be something bigger at hand as she keeps getting sick.  She has not taken anything for symptoms and admits that she is now off of her allergy and asthma medications due to losing her insurance.  No recent fevers, chest pain, shortness of breath or dizziness.  She reports that she did take a breathing treatment prior to coming to the office today and her symptoms are greatly improved.  Past Medical History:  Diagnosis Date  . Asthma   . Eczema   . Seasonal allergies     There are no problems to display for this patient.   Past Surgical History:  Procedure Laterality Date  . CHOLECYSTECTOMY    . TONSILLECTOMY      OB History   No obstetric history on file.      Home Medications    Prior to Admission medications   Medication Sig Start Date End Date Taking? Authorizing Provider  albuterol (VENTOLIN HFA) 108 (90 Base) MCG/ACT inhaler Inhale 1-2 puffs into the lungs every 6 (six) hours as needed for wheezing or shortness of breath. 10/17/20  Yes Lema Heinkel M, PA-C  fluticasone [facility]) 50 MCG/ACT nasal spray Place 2 sprays into both nostrils daily. 10/17/20  Yes Jhovanny Guinta M, PA-C  loratadine (CLARITIN) 10 MG tablet Take 1 tablet (10 mg total) by mouth daily. 10/17/20  Yes Izack Hoogland M, PA-C  montelukast (SINGULAIR) 10 MG tablet Take 1 tablet (10 mg total) by mouth at bedtime. 10/17/20  Yes Scotty Weigelt, Maralyn Sago M, PA-C  AZO-CRANBERRY PO Take by mouth.    [provider]  metroNIDAZOLE (FLAGYL)  500 MG tablet Take 1 tablet (500 mg total) by mouth 2 (two) times daily. 06/08/20   Lamptey, Britta Mccreedy, MD  dicyclomine (BENTYL) 20 MG tablet Take 1 tablet (20 mg total) by mouth 4 (four) times daily -  before meals and at bedtime. 04/30/20 06/05/20  Wieters, Hallie C, PA-C  esomeprazole (NEXIUM) 40 MG capsule Take 1 capsule (40 mg total) by mouth daily. 05/08/15 04/30/20  Arnaldo Natal, MD  famotidine (PEPCID) 20 MG tablet Take 1 tablet (20 mg total) by mouth 2 (two) times daily. 04/30/20 06/05/20  Wieters, Hallie C, PA-C  omeprazole (PRILOSEC) 20 MG capsule Take 1 capsule (20 mg total) by mouth 2 (two) times daily before a meal for 15 days. 04/30/20 06/05/20  Wieters, Junius Creamer, PA-C    Family History Family History  Problem Relation Age of Onset  . Hypertension Mother   . Migraines Mother   . Asthma Father   . Heart disease Father   . Hypertension Father   . Breast cancer Maternal Grandmother   . Colon cancer Maternal Grandmother   . Stomach cancer Maternal Grandmother   . Diabetes Maternal Grandmother     Social History Social History   Tobacco Use  . Smoking status: Current Every Day Smoker    Packs/day: 1.00    Types: Cigarettes  .  Smokeless tobacco: Never Used  Substance Use Topics  . Alcohol use: Yes    Comment: soc  . Drug use: Yes    Types: Marijuana     Allergies   Cat hair extract   Review of Systems Review of Systems  As stated above in HPI Physical Exam Triage Vital Signs ED Triage Vitals  Enc Vitals Group     BP 10/17/20 1043 122/80     Pulse Rate 10/17/20 1043 94     Resp 10/17/20 1043 19     Temp 10/17/20 1043 98.3 F (36.8 C)     Temp src --      SpO2 10/17/20 1043 98 %     Weight --      Height --      Head Circumference --      Peak Flow --      Pain Score 10/17/20 1041 0     Pain Loc --      Pain Edu? --      Excl. in GC? --    No data found.  Updated Vital Signs BP 122/80   Pulse 94   Temp 98.3 F (36.8 C)   Resp 19   LMP  10/16/2020 (Exact Date)   SpO2 98%   Physical Exam Vitals and nursing note reviewed.  Constitutional:      General: She is not in acute distress.    Appearance: Normal appearance. She is obese. She is not ill-appearing, toxic-appearing or diaphoretic.  HENT:     Head: Normocephalic and atraumatic.     Right Ear: Tympanic membrane, ear canal and external ear normal.     Left Ear: Tympanic membrane, ear canal and external ear normal.     Nose: Congestion and rhinorrhea present.     Mouth/Throat:     Mouth: Mucous membranes are moist.     Pharynx: No oropharyngeal exudate or posterior oropharyngeal erythema.  Eyes:     Extraocular Movements: Extraocular movements intact.     Pupils: Pupils are equal, round, and reactive to light.  Cardiovascular:     Rate and Rhythm: Normal rate and regular rhythm.     Heart sounds: Normal heart sounds.  Pulmonary:     Effort: Pulmonary effort is normal. No respiratory distress.     Breath sounds: Normal breath sounds. No stridor. No wheezing, rhonchi or rales.  Chest:     Chest wall: No tenderness.  Musculoskeletal:     Cervical back: Normal range of motion and neck supple.  Lymphadenopathy:     Cervical: No cervical adenopathy.  Skin:    General: Skin is warm.     Capillary Refill: Capillary refill takes less than 2 seconds.     Coloration: Skin is not jaundiced or pale.  Neurological:     General: No focal deficit present.     Mental Status: She is alert and oriented to person, place, and time.     Motor: No weakness.      UC Treatments / Results  Labs (all labs ordered are listed, but only abnormal results are displayed) Labs Reviewed - No data to display  EKG   Radiology DG Chest 2 View  Result Date: 10/17/2020 CLINICAL DATA:  Productive cough EXAM: CHEST - 2 VIEW COMPARISON:  10/16/2019 FINDINGS: The heart size and mediastinal contours are within normal limits. Both lungs are clear. The visualized skeletal structures are  unremarkable. IMPRESSION: No active cardiopulmonary disease. Electronically Signed   By: Marlan Palau M.D.  On: 10/17/2020 11:02    Procedures Procedures (including critical care time)  Medications Ordered in UC Medications - No data to display  Initial Impression / Assessment and Plan / UC Course  I have reviewed the triage vital signs and the nursing notes.  Pertinent labs & imaging results that were available during my care of the patient were reviewed by me and considered in my medical decision making (see chart for details).     New.  I believe that her stopping her allergy and asthma medication has a lot to do with her symptoms.  I discussed this with patient.  Restarting her on her medications and encouraging her to set up with a primary care provider.  We reviewed her chest x-ray which was normal.  Vitals and exam reassuring today.  Follow-up as needed. Final Clinical Impressions(s) / UC Diagnoses   Final diagnoses:  Cough  Wheezing   Discharge Instructions   None    ED Prescriptions    Medication Sig Dispense Auth. Provider   albuterol (VENTOLIN HFA) 108 (90 Base) MCG/ACT inhaler Inhale 1-2 puffs into the lungs every 6 (six) hours as needed for wheezing or shortness of breath. 18 g Railee Bonillas M, PA-C   montelukast (SINGULAIR) 10 MG tablet Take 1 tablet (10 mg total) by mouth at bedtime. 30 tablet Nahiem Dredge M, PA-C   loratadine (CLARITIN) 10 MG tablet Take 1 tablet (10 mg total) by mouth daily. 30 tablet Oceana Walthall M, PA-C   fluticasone [facility]) 50 MCG/ACT nasal spray Place 2 sprays into both nostrils daily. 16 g Kimbree Casanas M, [place]     PDMP not reviewed this encounter.   Rushie Chestnut, [place] 10/17/20 1143

## 2020-10-17 NOTE — ED Triage Notes (Signed)
Pt in with c/o productive coughing, dizziness, headache, loss of appetite that has been going on for about 1 week now. Also c/o expectorating excessive amounts of green mucous   Pt has been using her inhaler and breathing treatment with minimal relief  Also has been taking robitussin

## 2021-12-02 ENCOUNTER — Ambulatory Visit
Admission: RE | Admit: 2021-12-02 | Discharge: 2021-12-02 | Disposition: A | Discharge status: Home / Self Care | Payer: Medicaid Other | Source: Ambulatory Visit | Attending: Internal Medicine | Admitting: Internal Medicine

## 2021-12-02 VITALS — BP 117/73 | HR 106 | Temp 97.3°F | Resp 18

## 2021-12-02 DIAGNOSIS — N898 Other specified noninflammatory disorders of vagina: Secondary | ICD-10-CM | POA: Insufficient documentation

## 2021-12-02 DIAGNOSIS — N390 Urinary tract infection, site not specified: Secondary | ICD-10-CM | POA: Insufficient documentation

## 2021-12-02 DIAGNOSIS — R1032 Left lower quadrant pain: Secondary | ICD-10-CM | POA: Insufficient documentation

## 2021-12-02 DIAGNOSIS — R519 Headache, unspecified: Secondary | ICD-10-CM | POA: Insufficient documentation

## 2021-12-02 DIAGNOSIS — Z113 Encounter for screening for infections with a predominantly sexual mode of transmission: Secondary | ICD-10-CM | POA: Insufficient documentation

## 2021-12-02 LAB — POCT URINALYSIS DIP (MANUAL ENTRY)
Bilirubin, UA: NEGATIVE
Blood, UA: NEGATIVE
Glucose, UA: NEGATIVE mg/dL
Ketones, POC UA: NEGATIVE mg/dL
Nitrite, UA: NEGATIVE
Protein Ur, POC: 30 mg/dL — AB
Spec Grav, UA: >=1.03 — AB (ref 1.010–1.025)
Urobilinogen, UA: 1 E.U./dL
pH, UA: 5.5 (ref 5.0–8.0)

## 2021-12-02 LAB — POCT URINE PREGNANCY: Preg Test, Ur: NEGATIVE

## 2021-12-02 MED ORDER — FLUCONAZOLE 150 MG PO TABS: 150.0000 mg | ORAL_TABLET | Freq: Every day | ORAL | 0 refills | Status: DC

## 2021-12-02 MED ORDER — NITROFURANTOIN MONOHYD MACRO 100 MG PO CAPS: 100.0000 mg | ORAL_CAPSULE | Freq: Two times a day (BID) | ORAL | 0 refills | Status: DC

## 2021-12-02 NOTE — ED Triage Notes (Addendum)
Patient presents to Urgent Care with complaints of LLQ pain since last night. Patient reports pain comes and goes, sharp shooting pain, aggravated by using restroom. Pt reports more frequent bm, and recent ha. Pt also requesting normal check up while here.  ? ?Pt also reports vaginal itching and otc azo. Pt requesting urine dipstick.  ?

## 2021-12-02 NOTE — Discharge Instructions (Addendum)
You have been prescribed antibiotic for suspected urinary tract infection and Diflucan for suspected yeast infection.  Urine culture, vaginal swab, blood work are pending.  We will call if they are positive.  Please refrain from sexual activity until test results and treatment are complete.  Please follow-up with headache clinic at provided contact information today for further evaluation and management of your persistent headaches. ?

## 2021-12-02 NOTE — ED Provider Notes (Signed)
?[facility] ? ? ? ?CSN: [id number] ?Arrival date & time: 12/02/21  1130 ? ? ?  ? ?History   ?Chief Complaint ?Chief Complaint  ?Patient presents with  ? Abdominal Pain  ?  Really bad cramps and sharp pains , plus bad migraines - Entered by patient  ? ? ?HPI ?Michele Vasquez is a 21 y.o. female.  ? ?Patient presents with left lower quadrant abdominal pain, dysuria, vaginal itching that has been present since last night.  Denies vaginal discharge, hematuria, abnormal vaginal bleeding, urinary frequency, back pain, fever.  Denies nausea, vomiting, diarrhea, constipation.  Having regular bowel movements.  Denies blood in stool.  Denies any known exposure to STD but is requesting STD testing including blood work for HIV and syphilis. ? ?Patient also reports that she been having more frequent headaches over the past month.  She reports that she has had a history of similar migraines but has never been formally diagnosed with migraines.  She typically has nausea without vomiting associated with her migraines but denies blurry vision or dizziness.  Headache is present in the right side of the head.  Denies any current headache.  Last headache was a few days prior.  Headaches typically resolve with Excedrin.  She has never seen a specialist for this. ? ? ?Abdominal Pain ? ?Past Medical History:  ?Diagnosis Date  ? Asthma   ? Eczema   ? Seasonal allergies   ? ? ?There are no problems to display for this patient. ? ? ?Past Surgical History:  ?Procedure Laterality Date  ? CHOLECYSTECTOMY    ? TONSILLECTOMY    ? ? ?OB History   ?No obstetric history on file. ?  ? ? ? ?Home Medications   ? ?Prior to Admission medications   ?Medication Sig Start Date End Date Taking? Authorizing Provider  ?fluconazole (DIFLUCAN) 150 MG tablet Take 1 tablet (150 mg total) by mouth daily. 12/02/21  Yes Areal Cochrane, Acie Fredrickson, FNP  ?nitrofurantoin, macrocrystal-monohydrate, (MACROBID) 100 MG capsule Take 1 capsule (100 mg total) by mouth 2  (two) times daily. 12/02/21  Yes Andra Matsuo, Acie Fredrickson, FNP  ?albuterol (VENTOLIN HFA) 108 (90 Base) MCG/ACT inhaler Inhale 1-2 puffs into the lungs every 6 (six) hours as needed for wheezing or shortness of breath. 10/17/20   Rushie Chestnut, PA-C  ?AZO-CRANBERRY PO Take by mouth.    [provider]  ?fluticasone (FLONASE) 50 MCG/ACT nasal spray Place 2 sprays into both nostrils daily. 10/17/20   Rushie Chestnut, PA-C  ?loratadine (CLARITIN) 10 MG tablet Take 1 tablet (10 mg total) by mouth daily. 10/17/20   Rushie Chestnut, PA-C  ?metroNIDAZOLE (FLAGYL) 500 MG tablet Take 1 tablet (500 mg total) by mouth 2 (two) times daily. 06/08/20   LampteyBritta Mccreedy, MD  ?montelukast (SINGULAIR) 10 MG tablet Take 1 tablet (10 mg total) by mouth at bedtime. 10/17/20   Rushie Chestnut, PA-C  ?dicyclomine (BENTYL) 20 MG tablet Take 1 tablet (20 mg total) by mouth 4 (four) times daily -  before meals and at bedtime. 04/30/20 06/05/20  Wieters, Hallie C, PA-C  ?esomeprazole (NEXIUM) 40 MG capsule Take 1 capsule (40 mg total) by mouth daily. 05/08/15 04/30/20  Arnaldo Natal, MD  ?famotidine (PEPCID) 20 MG tablet Take 1 tablet (20 mg total) by mouth 2 (two) times daily. 04/30/20 06/05/20  Wieters, Hallie C, PA-C  ?omeprazole (PRILOSEC) 20 MG capsule Take 1 capsule (20 mg total) by mouth 2 (two) times daily before a  meal for 15 days. 04/30/20 06/05/20  Wieters, Hallie C, PA-C  ? ? ?Family History ?Family History  ?Problem Relation Age of Onset  ? Hypertension Mother   ? Migraines Mother   ? Asthma Father   ? Heart disease Father   ? Hypertension Father   ? Breast cancer Maternal Grandmother   ? Colon cancer Maternal Grandmother   ? Stomach cancer Maternal Grandmother   ? Diabetes Maternal Grandmother   ? ? ?Social History ?Social History  ? ?Tobacco Use  ? Smoking status: Every Day  ?  Packs/day: 0.50  ?  Types: Cigarettes  ? Smokeless tobacco: Never  ?Substance Use Topics  ? Alcohol use: Yes  ?  Comment: soc  ? Drug use: Not  Currently  ?  Types: Marijuana  ? ? ? ?Allergies   ?Cat hair extract ? ? ?Review of Systems ?Review of Systems ?Per HPI ? ?Physical Exam ?Triage Vital Signs ?ED Triage Vitals  ?Enc Vitals Group  ?   BP 12/02/21 1209 117/73  ?   Pulse Rate 12/02/21 1158 (!) 106  ?   Resp 12/02/21 1158 18  ?   Temp 12/02/21 1158 (!) 97.3 ?F (36.3 ?C)  ?   Temp src --   ?   SpO2 12/02/21 1158 99 %  ?   Weight --   ?   Height --   ?   Head Circumference --   ?   Peak Flow --   ?   Pain Score 12/02/21 1156 7  ?   Pain Loc --   ?   Pain Edu? --   ?   Excl. in GC? --   ? ?No data found. ? ?Updated Vital Signs ?BP 117/73   Pulse (!) 106   Temp (!) 97.3 ?F (36.3 ?C)   Resp 18   LMP 11/16/2021   SpO2 99%  ? ?Visual Acuity ?Right Eye Distance:   ?Left Eye Distance:   ?Bilateral Distance:   ? ?Right Eye Near:   ?Left Eye Near:    ?Bilateral Near:    ? ?Physical Exam ?Constitutional:   ?   General: She is not in acute distress. ?   Appearance: Normal appearance. She is not toxic-appearing or diaphoretic.  ?HENT:  ?   Head: Normocephalic and atraumatic.  ?Eyes:  ?   Extraocular Movements: Extraocular movements intact.  ?   Conjunctiva/sclera: Conjunctivae normal.  ?   Pupils: Pupils are equal, round, and reactive to light.  ?Cardiovascular:  ?   Rate and Rhythm: Normal rate and regular rhythm.  ?   Pulses: Normal pulses.  ?   Heart sounds: Normal heart sounds.  ?Pulmonary:  ?   Effort: Pulmonary effort is normal. No respiratory distress.  ?   Breath sounds: Normal breath sounds.  ?Abdominal:  ?   General: Bowel sounds are normal. There is no distension.  ?   Palpations: Abdomen is soft.  ?   Tenderness: There is no abdominal tenderness.  ?Neurological:  ?   General: No focal deficit present.  ?   Mental Status: She is alert and oriented to person, place, and time. Mental status is at baseline.  ?   Cranial Nerves: Cranial nerves 2-12 are intact.  ?   Sensory: Sensation is intact.  ?   Motor: Motor function is intact.  ?   Coordination:  Coordination is intact.  ?   Gait: Gait is intact.  ?Psychiatric:     ?   Mood  and Affect: Mood normal.     ?   Behavior: Behavior normal.     ?   Thought Content: Thought content normal.     ?   Judgment: Judgment normal.  ? ? ? ?UC Treatments / Results  ?Labs ?(all labs ordered are listed, but only abnormal results are displayed) ?Labs Reviewed  ?POCT URINALYSIS DIP (MANUAL ENTRY) - Abnormal; Notable for the following components:  ?    Result Value  ? Clarity, UA cloudy (*)   ? Spec Grav, UA >=1.030 (*)   ? Protein Ur, POC =30 (*)   ? Leukocytes, UA Trace (*)   ? All other components within normal limits  ?URINE CULTURE  ?RPR  ?HIV ANTIBODY (ROUTINE TESTING W REFLEX)  ?POCT URINE PREGNANCY  ?CERVICOVAGINAL ANCILLARY ONLY  ? ? ?EKG ? ? ?Radiology ?No results found. ? ?Procedures ?Procedures (including critical care time) ? ?Medications Ordered in UC ?Medications - No data to display ? ?Initial Impression / Assessment and Plan / UC Course  ?I have reviewed the triage vital signs and the nursing notes. ? ?Pertinent labs & imaging results that were available during my care of the patient were reviewed by me and considered in my medical decision making (see chart for details). ? ?  ? ?Urinalysis indicating possible urinary tract infection.  Although, I am suspicious of vaginitis related to yeast as well given vaginal itching.  Will treat and cover for both with Macrobid and Diflucan.  Cervicovaginal swab, urine culture, blood work for HIV and syphilis pending.  Will add or change treatment once results are complete.  Patient advised to refrain from sexual activity until test results and treatment are complete. ? ?Patient also having more persistent migraines.  Neuro exam is normal and patient does not have migraine currently.  Advised patient that it would be best for her to follow-up with headache clinic at provided contact information for further evaluation and management.  Patient given strict return and ER  precautions.  Patient verbalized understanding and was agreeable with plan. ?Final Clinical Impressions(s) / UC Diagnoses  ? ?Final diagnoses:  ?Lower urinary tract infection  ?Vaginal itching  ?Abdominal pain, left lower qu

## 2021-12-03 LAB — URINE CULTURE: Culture: NO GROWTH

## 2021-12-03 LAB — CERVICOVAGINAL ANCILLARY ONLY
Bacterial Vaginitis (gardnerella): POSITIVE — AB
Candida Glabrata: NEGATIVE
Candida Vaginitis: NEGATIVE
Chlamydia: NEGATIVE
Comment: NEGATIVE
Comment: NEGATIVE
Comment: NEGATIVE
Comment: NEGATIVE
Comment: NEGATIVE
Comment: NORMAL
Neisseria Gonorrhea: NEGATIVE
Trichomonas: NEGATIVE

## 2021-12-03 LAB — HIV ANTIBODY (ROUTINE TESTING W REFLEX): HIV Screen 4th Generation wRfx: NONREACTIVE

## 2021-12-03 LAB — RPR: RPR Ser Ql: NONREACTIVE

## 2021-12-04 ENCOUNTER — Telehealth: Payer: Self-pay

## 2021-12-04 MED ORDER — METRONIDAZOLE 500 MG PO TABS: 500.0000 mg | ORAL_TABLET | Freq: Two times a day (BID) | ORAL | 0 refills | Status: DC

## 2021-12-04 NOTE — Telephone Encounter (Signed)
Pt tested BV(+) flagyl sent to pharmacy per protocol. Education provided.  ?

## 2022-02-08 ENCOUNTER — Emergency Department (HOSPITAL_COMMUNITY)
Admission: EM | Admit: 2022-02-08 | Discharge: 2022-02-09 | Disposition: A | Discharge status: Home / Self Care | Payer: Self-pay | Attending: Emergency Medicine | Admitting: Emergency Medicine

## 2022-02-08 DIAGNOSIS — J45909 Unspecified asthma, uncomplicated: Secondary | ICD-10-CM | POA: Insufficient documentation

## 2022-02-08 DIAGNOSIS — R519 Headache, unspecified: Secondary | ICD-10-CM

## 2022-02-08 DIAGNOSIS — Z7951 Long term (current) use of inhaled steroids: Secondary | ICD-10-CM | POA: Insufficient documentation

## 2022-02-09 ENCOUNTER — Emergency Department (HOSPITAL_COMMUNITY): Payer: Medicaid Other

## 2022-02-09 ENCOUNTER — Other Ambulatory Visit: Payer: Self-pay

## 2022-02-09 LAB — I-STAT BETA HCG BLOOD, ED (MC, WL, AP ONLY): I-stat hCG, quantitative: <5 m[IU]/mL (ref <5)

## 2022-02-09 MED ORDER — DIPHENHYDRAMINE HCL 50 MG/ML IJ SOLN
12.5000 mg | Freq: Once | INTRAMUSCULAR | Status: AC
  Administered 2022-02-09: 12.5 mg via INTRAVENOUS
  Filled 2022-02-09: qty 1

## 2022-02-09 MED ORDER — LIDOCAINE 5 % EX PTCH
1.0000 | MEDICATED_PATCH | Freq: Every day | CUTANEOUS | 0 refills | Status: AC | PRN
Start: 2022-02-09 — End: ?

## 2022-02-09 MED ORDER — SODIUM CHLORIDE 0.9 % IV BOLUS
1000.0000 mL | Freq: Once | INTRAVENOUS | Status: AC
  Administered 2022-02-09: 1000 mL via INTRAVENOUS

## 2022-02-09 MED ORDER — LIDOCAINE 5 % EX PTCH
2.0000 | MEDICATED_PATCH | CUTANEOUS | Status: DC
  Administered 2022-02-09: 2 via TRANSDERMAL
  Filled 2022-02-09: qty 2

## 2022-02-09 MED ORDER — PROCHLORPERAZINE EDISYLATE 10 MG/2ML IJ SOLN
10.0000 mg | Freq: Once | INTRAMUSCULAR | Status: AC
  Administered 2022-02-09: 10 mg via INTRAVENOUS
  Filled 2022-02-09: qty 2

## 2022-02-09 MED ORDER — NAPROXEN 500 MG PO TABS: 500.0000 mg | ORAL_TABLET | Freq: Two times a day (BID) | ORAL | 0 refills | Status: DC | PRN

## 2022-02-09 NOTE — ED Provider Notes (Signed)
[facility]-EMERGENCY DEPT Provider Note   CSN: [id number] Arrival date & time: 02/08/22  2349     History  Chief Complaint  Patient presents with   Headache    Michele Vasquez is a 21 y.o. female with a hx of asthma & eczema who presents to the ED with complaints of headache for the past 2 weeks.  Headache is mostly to the back of her head but can occur in the temples, it is squeezing/sharp in nature.  Worse with bright lights and loud noises, alleviated somewhat with Goody powder or Excedrin Migraine.  She has had headaches in the past but never 1 that has lasted this long or felt quite like this.  Had gradual onset with steady progression.  She denies fever, chills, change in vision, numbness, tingling, weakness, dizziness, or syncope. Her pcp prescribed her a muscle relaxant to try to help with this, she has not picked this up yet.   HPI     Home Medications Prior to Admission medications   Medication Sig Start Date End Date Taking? Authorizing Provider  albuterol (VENTOLIN HFA) 108 (90 Base) MCG/ACT inhaler Inhale 1-2 puffs into the lungs every 6 (six) hours as needed for wheezing or shortness of breath. 10/17/20   Rushie Chestnut, PA-C  AZO-CRANBERRY PO Take by mouth.    [provider]  fluconazole (DIFLUCAN) 150 MG tablet Take 1 tablet (150 mg total) by mouth daily. 12/02/21   Gustavus Bryant, FNP  fluticasone (FLONASE) 50 MCG/ACT nasal spray Place 2 sprays into both nostrils daily. 10/17/20   Rushie Chestnut, PA-C  loratadine (CLARITIN) 10 MG tablet Take 1 tablet (10 mg total) by mouth daily. 10/17/20   Rushie Chestnut, PA-C  metroNIDAZOLE (FLAGYL) 500 MG tablet Take 1 tablet (500 mg total) by mouth 2 (two) times daily. 12/04/21   Lamptey, Britta Mccreedy, MD  montelukast (SINGULAIR) 10 MG tablet Take 1 tablet (10 mg total) by mouth at bedtime. 10/17/20   Rushie Chestnut, PA-C  nitrofurantoin, macrocrystal-monohydrate, (MACROBID) 100 MG  capsule Take 1 capsule (100 mg total) by mouth 2 (two) times daily. 12/02/21   Gustavus Bryant, FNP  dicyclomine (BENTYL) 20 MG tablet Take 1 tablet (20 mg total) by mouth 4 (four) times daily -  before meals and at bedtime. 04/30/20 06/05/20  Wieters, Hallie C, PA-C  esomeprazole (NEXIUM) 40 MG capsule Take 1 capsule (40 mg total) by mouth daily. 05/08/15 04/30/20  Arnaldo Natal, MD  famotidine (PEPCID) 20 MG tablet Take 1 tablet (20 mg total) by mouth 2 (two) times daily. 04/30/20 06/05/20  Wieters, Hallie C, PA-C  omeprazole (PRILOSEC) 20 MG capsule Take 1 capsule (20 mg total) by mouth 2 (two) times daily before a meal for 15 days. 04/30/20 06/05/20  Wieters, Hallie C, PA-C      Allergies    Cat hair extract    Review of Systems   Review of Systems  Constitutional:  Negative for chills and fever.  Eyes:  Negative for visual disturbance.  Respiratory:  Negative for shortness of breath.   Cardiovascular:  Negative for chest pain.  Gastrointestinal:  Negative for nausea and vomiting.  Neurological:  Positive for headaches. Negative for dizziness, syncope, speech difficulty, weakness and numbness.  All other systems reviewed and are negative.   Physical Exam Updated Vital Signs BP 131/77 (BP Location: Left Arm)   Pulse 88   Temp 98.3 F (36.8 C) (Oral)   Resp 16  Ht 5' (1.524 m)   Wt 97.1 kg   SpO2 100%   BMI 41.79 kg/m  Physical Exam Vitals and nursing note reviewed.  Constitutional:      General: She is not in acute distress.    Appearance: Normal appearance. She is not toxic-appearing.  HENT:     Head: Normocephalic and atraumatic.     Mouth/Throat:     Pharynx: Oropharynx is clear. Uvula midline.  Eyes:     General: Vision grossly intact. Gaze aligned appropriately.     Extraocular Movements: Extraocular movements intact.     Conjunctiva/sclera: Conjunctivae normal.     Pupils: Pupils are equal, round, and reactive to light.     Comments: No proptosis.   Cardiovascular:      Rate and Rhythm: Normal rate and regular rhythm.  Pulmonary:     Effort: Pulmonary effort is normal.     Breath sounds: Normal breath sounds.  Abdominal:     General: There is no distension.     Palpations: Abdomen is soft.     Tenderness: There is no abdominal tenderness. There is no guarding or rebound.  Musculoskeletal:     Cervical back: Normal range of motion and neck supple. No rigidity. Muscular tenderness present.  Skin:    General: Skin is warm and dry.  Neurological:     Mental Status: She is alert.     Comments: Alert. Clear speech. No facial droop. CNIII-XII grossly intact. Bilateral upper and lower extremities' sensation grossly intact. 5/5 symmetric strength with grip strength and with plantar and dorsi flexion bilaterally . Normal finger to nose bilaterally. Negative pronator drift. Gait intact.    Psychiatric:        Mood and Affect: Mood normal.        Behavior: Behavior normal.     ED Results / Procedures / Treatments   Labs (all labs ordered are listed, but only abnormal results are displayed) Labs Reviewed  I-STAT BETA HCG BLOOD, ED ([facility], [facility], [facility] ONLY)    EKG None  Radiology CT Head Wo Contrast  Result Date: 02/09/2022 CLINICAL DATA:  Headache, new or worsening and positional. Migraine headache for 2 weeks EXAM: CT HEAD WITHOUT CONTRAST TECHNIQUE: Contiguous axial images were obtained from the base of the skull through the vertex without intravenous contrast. RADIATION DOSE REDUCTION: This exam was performed according to the departmental dose-optimization program which includes automated exposure control, adjustment of the mA and/or kV according to patient size and/or use of iterative reconstruction technique. COMPARISON:  None Available. FINDINGS: Brain: No evidence of acute infarction, hemorrhage, hydrocephalus, extra-axial collection or mass lesion/mass effect. Vascular: No hyperdense vessel or unexpected calcification. Skull: Normal. Negative for fracture or  focal lesion. Sinuses/Orbits: No acute finding. IMPRESSION: Negative head CT. Electronically Signed   By: Tiburcio Pea M.D.   On: 02/09/2022 07:58    Procedures Procedures    Medications Ordered in ED Medications - No data to display  ED Course/ Medical Decision Making/ A&P                           Medical Decision Making Amount and/or Complexity of Data Reviewed Radiology: ordered.  Risk Prescription drug management.  Patient presents to the ED with complaints of headache, this involves an extensive number of treatment options, and is a complaint that carries with it a high risk of complications and morbidity. Nontoxic, vitals fairly unremarkable. .   Additional history obtained:  Chart/nursing  notes reviewed.   Lab Tests:  I viewed & interpreted labs including:  Pregnancy test: Negative  Imaging Studies:  I ordered and viewed the following imaging, agree with radiologist impression:  CT head wo contrast: negative head CT.   ED Course:  I ordered medications including migraine cocktail (compazine/benadryl) and fluids.   On re-assessment patient woken from sleep, she feels much better.   Patient has hx of similar headaches, gradual onset with steady progression in severity, patient is afebrile with no focal neuro deficits, dizziness, change in vision, proptosis, or nuchal rigidity- low suspicion for [facility], [facility], ischemic CVA, dural venous sinus thrombosis, acute glaucoma, giant cell arteritis, mass, or meningitis. Overall seems reasonable for discharge. I discussed results, treatment plan, need for PCP follow-up, and return precautions with the patient. Provided opportunity for questions, patient confirmed understanding and is in agreement with plan.    Portions of this note were generated with Scientist, clinical (histocompatibility and immunogenetics). Dictation errors may occur despite best attempts at proofreading.   Final Clinical Impression(s) / ED Diagnoses Final diagnoses:  Acute nonintractable  headache, unspecified headache type    Rx / DC Orders ED Discharge Orders          Ordered    lidocaine (LIDODERM) 5 %  Daily PRN        02/09/22 0908    naproxen (NAPROSYN) 500 MG tablet  2 times daily PRN        02/09/22 0908              Johana Hopkinson, Pleas Koch, PA-C 02/09/22 0956    Virgina Norfolk, DO 02/09/22 1054

## 2022-02-09 NOTE — ED Notes (Signed)
Patient transported to CT 

## 2022-02-09 NOTE — ED Triage Notes (Signed)
Ambulatory to ED with c/o migraine x 2 weeks. Seen at PCP, given some pain meds. Denies N/V, blurry vision. Taken tylenol, BC powder, and Excedrin migraine without relief.

## 2022-02-09 NOTE — Discharge Instructions (Addendum)
Your head CT was normal. Your pregnancy test is negative.   Please fill the prescription for the muscle relaxant prescribed by primary care  We are also sending you home with the following prescriptions:  - Naproxen- this is a nonsteroidal anti-inflammatory medication that will help with pain and swelling. Be sure to take this medication as prescribed with food, 1 pill every 12 hours,  It should be taken with food, as it can cause stomach upset, and more seriously, stomach bleeding. Do not take other nonsteroidal anti-inflammatory medications with this such as Advil, Motrin, Aleve, Mobic, Goodie Powder, or Motrin etc..    - Lidoderm patch- Apply 1 patch to your area of most significant pain once per day to help numb/soothe this area. Remove & discard patch within 12 hours of application. Do not apply heat over the patches.   You make take Tylenol per over the counter dosing with these medications.   We have prescribed you new medication(s) today. Discuss the medications prescribed today with your pharmacist as they can have adverse effects and interactions with your other medicines including over the counter and prescribed medications. Seek medical evaluation if you start to experience new or abnormal symptoms after taking one of these medicines, seek care immediately if you start to experience difficulty breathing, feeling of your throat closing, facial swelling, or rash as these could be indications of a more serious allergic reaction    Follow up with primary care in 3 days. Return to the ER for new or worsening symptoms or any other concerns.

## 2022-02-11 ENCOUNTER — Ambulatory Visit (HOSPITAL_COMMUNITY): Payer: Medicaid Other

## 2022-02-13 ENCOUNTER — Ambulatory Visit
Admission: RE | Admit: 2022-02-13 | Discharge: 2022-02-13 | Disposition: A | Discharge status: Home / Self Care | Payer: Medicaid Other | Source: Ambulatory Visit | Attending: Family Medicine | Admitting: Family Medicine

## 2022-02-13 VITALS — BP 104/69 | HR 90 | Temp 98.6°F | Resp 16

## 2022-02-13 DIAGNOSIS — N760 Acute vaginitis: Secondary | ICD-10-CM

## 2022-02-13 DIAGNOSIS — Z113 Encounter for screening for infections with a predominantly sexual mode of transmission: Secondary | ICD-10-CM

## 2022-02-13 DIAGNOSIS — N898 Other specified noninflammatory disorders of vagina: Secondary | ICD-10-CM | POA: Insufficient documentation

## 2022-02-13 LAB — POCT URINALYSIS DIP (MANUAL ENTRY)
Blood, UA: NEGATIVE
Glucose, UA: NEGATIVE mg/dL
Nitrite, UA: NEGATIVE
Protein Ur, POC: 30 mg/dL — AB
Spec Grav, UA: >=1.03 — AB (ref 1.010–1.025)
Urobilinogen, UA: 0.2 E.U./dL
pH, UA: 5.5 (ref 5.0–8.0)

## 2022-02-13 NOTE — ED Triage Notes (Signed)
Pt c/o an excessive amount of green discharge with odor x 2 days

## 2022-02-13 NOTE — Discharge Instructions (Addendum)
Your lab results can take up to 3 to 4 days.   Avoiding any unprotected sex while awaiting your results.  Our office will notify you of any treatment warranted after your test result.  However if you have any questions or if you see a positive result  given the upcoming weekend,  you may call directly to the office and asked to speak with the nurse.

## 2022-02-13 NOTE — ED Provider Notes (Signed)
Michele Vasquez    CSN: [id number] Arrival date & time: 02/13/22  1645      History   Chief Complaint Chief Complaint  Patient presents with   Vaginal Discharge    Discharge is green - Entered by patient    HPI Michele Vasquez is a 21 y.o. female.   HPI Patient presents for evaluation of vaginal discharge. She reports greenish discharge x 2 days. Her menstrual cycle ended two days ago. The following the day, she developed the discharge.  She denies any burning, itching.  She reports that she is only sexually active with 1 partner and he is asymptomatic.  Review of EMR patient has a distant history of gonorrhea and chlamydia.  Past Medical History:  Diagnosis Date   Asthma    Eczema    Seasonal allergies     There are no problems to display for this patient.   Past Surgical History:  Procedure Laterality Date   CHOLECYSTECTOMY     TONSILLECTOMY      OB History   No obstetric history on file.      Home Medications    Prior to Admission medications   Medication Sig Start Date End Date Taking? Authorizing Provider  albuterol (VENTOLIN HFA) 108 (90 Base) MCG/ACT inhaler Inhale 1-2 puffs into the lungs every 6 (six) hours as needed for wheezing or shortness of breath. 10/17/20  Yes Covington, Sarah M, PA-C  fluticasone [facility]) 50 MCG/ACT nasal spray Place 2 sprays into both nostrils daily. 10/17/20  Yes Covington, Maralyn Sago M, PA-C  lidocaine (LIDODERM) 5 % Place 1 patch onto the skin daily as needed. Apply patch to area most significant pain once per day.  Remove and discard patch within 12 hours of application. 02/09/22  Yes Petrucelli, Samantha R, PA-C  loratadine (CLARITIN) 10 MG tablet Take 1 tablet (10 mg total) by mouth daily. 10/17/20  Yes Covington, Sarah M, PA-C  montelukast (SINGULAIR) 10 MG tablet Take 1 tablet (10 mg total) by mouth at bedtime. 10/17/20  Yes Covington, Sarah M, PA-C  naproxen (NAPROSYN) 500 MG tablet Take 1 tablet (500 mg total) by  mouth 2 (two) times daily as needed for moderate pain. 02/09/22  Yes Petrucelli, Samantha R, PA-C  AZO-CRANBERRY PO Take by mouth.    [provider]  fluconazole (DIFLUCAN) 150 MG tablet Take 1 tablet (150 mg total) by mouth daily. 12/02/21   Gustavus Bryant, FNP  metroNIDAZOLE (FLAGYL) 500 MG tablet Take 1 tablet (500 mg total) by mouth 2 (two) times daily. 12/04/21   Lamptey, Britta Mccreedy, MD  nitrofurantoin, macrocrystal-monohydrate, (MACROBID) 100 MG capsule Take 1 capsule (100 mg total) by mouth 2 (two) times daily. 12/02/21   Gustavus Bryant, FNP  SUMAtriptan (IMITREX) 50 MG tablet SMARTSIG:1 Tablet(s) By Mouth 1-2 Times Daily 02/08/22   [provider]  dicyclomine (BENTYL) 20 MG tablet Take 1 tablet (20 mg total) by mouth 4 (four) times daily -  before meals and at bedtime. 04/30/20 06/05/20  Wieters, Hallie C, PA-C  esomeprazole (NEXIUM) 40 MG capsule Take 1 capsule (40 mg total) by mouth daily. 05/08/15 04/30/20  Arnaldo Natal, MD  famotidine (PEPCID) 20 MG tablet Take 1 tablet (20 mg total) by mouth 2 (two) times daily. 04/30/20 06/05/20  Wieters, Hallie C, PA-C  omeprazole (PRILOSEC) 20 MG capsule Take 1 capsule (20 mg total) by mouth 2 (two) times daily before a meal for 15 days. 04/30/20 06/05/20  Wieters, Junius Creamer, PA-C  Family History Family History  Problem Relation Age of Onset   Hypertension Mother    Migraines Mother    Asthma Father    Heart disease Father    Hypertension Father    Breast cancer Maternal Grandmother    Colon cancer Maternal Grandmother    Stomach cancer Maternal Grandmother    Diabetes Maternal Grandmother     Social History Social History   Tobacco Use   Smoking status: Every Day    Packs/day: 0.50    Types: Cigarettes   Smokeless tobacco: Never  Vaping Use   Vaping Use: Never used  Substance Use Topics   Alcohol use: Yes    Comment: soc   Drug use: Not Currently    Types: Marijuana     Allergies   Cat hair extract   Review of  Systems Review of Systems Pertinent negatives listed in HPI   Physical Exam Triage Vital Signs ED Triage Vitals  Enc Vitals Group     BP      Pulse      Resp      Temp      Temp src      SpO2      Weight      Height      Head Circumference      Peak Flow      Pain Score      Pain Loc      Pain Edu?      Excl. in GC?    No data found.  Updated Vital Signs BP 104/69 (BP Location: Left Arm)   Pulse 90   Temp 98.6 F (37 C) (Oral)   Resp 16   LMP 02/09/2022 (Approximate)   SpO2 98%   Visual Acuity Right Eye Distance:   Left Eye Distance:   Bilateral Distance:    Right Eye Near:   Left Eye Near:    Bilateral Near:     Physical Exam General appearance:Alert, well developed, well nourished, cooperative and in no distress Head: Normocephalic, without obvious abnormality, atraumatic Heart: Rate and rhythm normal. No gallop or murmurs noted on exam  Respiratory: Respirations even and unlabored, normal respiratory rate Extremities: No gross deformities Skin: Skin color, texture, turgor normal. No rashes seen  Psych: Appropriate mood and affect. Neurologic: No focal neurological deficits present during exam Vaginal Cytology pending. UC Treatments / Results  Labs (all labs ordered are listed, but only abnormal results are displayed) Labs Reviewed  POCT URINALYSIS DIP (MANUAL ENTRY) - Abnormal; Notable for the following components:      Result Value   Color, UA other (*)    Clarity, UA cloudy (*)    Bilirubin, UA small (*)    Ketones, POC UA trace (5) (*)    Spec Grav, UA >=1.030 (*)    Protein Ur, POC =30 (*)    Leukocytes, UA Trace (*)    All other components within normal limits  URINE CULTURE  CERVICOVAGINAL ANCILLARY ONLY    EKG   Radiology No results found.  Procedures Procedures (including critical care time)  Medications Ordered in UC Medications - No data to display  Initial Impression / Assessment and Plan / UC Course  I have reviewed  the triage vital signs and the nursing notes.  Pertinent labs & imaging results that were available during my care of the patient were reviewed by me and considered in my medical decision making (see chart for details).    Screen for STD and  abnormal vaginal discharge Vaginal cytology pending.  Will await cytology results before prescribing any treatment given patient is not having any itching or burning.  Advised to use condoms while engaging in any sexual intercourse. Final Clinical Impressions(s) / UC Diagnoses   Final diagnoses:  Screen for STD (sexually transmitted disease)  Vaginal discharge     Discharge Instructions      Your lab results can take up to 3 to 4 days.   Avoiding any unprotected sex while awaiting your results.  Our office will notify you of any treatment warranted after your test result.  However if you have any questions or if you see a positive result  given the upcoming weekend,  you may call directly to the office and asked to speak with the nurse.     ED Prescriptions   None    PDMP not reviewed this encounter.   Bing Neighbors, FNP 02/13/22 1740

## 2022-02-14 ENCOUNTER — Telehealth (HOSPITAL_COMMUNITY): Payer: Self-pay | Admitting: Emergency Medicine

## 2022-02-14 LAB — CERVICOVAGINAL ANCILLARY ONLY
Bacterial Vaginitis (gardnerella): NEGATIVE
Candida Glabrata: NEGATIVE
Candida Vaginitis: NEGATIVE
Chlamydia: NEGATIVE
Comment: NEGATIVE
Comment: NEGATIVE
Comment: NEGATIVE
Comment: NEGATIVE
Comment: NEGATIVE
Comment: NORMAL
Neisseria Gonorrhea: NEGATIVE
Trichomonas: POSITIVE — AB

## 2022-02-14 LAB — URINE CULTURE
Culture: <10000 — AB
Special Requests: NORMAL

## 2022-02-14 MED ORDER — METRONIDAZOLE 500 MG PO TABS: 500.0000 mg | ORAL_TABLET | Freq: Two times a day (BID) | ORAL | 0 refills | Status: DC

## 2022-02-17 ENCOUNTER — Telehealth: Payer: Self-pay

## 2022-02-17 MED ORDER — METRONIDAZOLE 500 MG PO TABS: 500.0000 mg | ORAL_TABLET | Freq: Two times a day (BID) | ORAL | 0 refills | Status: DC

## 2022-02-18 LAB — RPR, QUANT+TP ABS (REFLEX)
Rapid Plasma Reagin, Quant: 1:64 {titer} — ABNORMAL HIGH
T Pallidum Abs: REACTIVE — AB

## 2022-02-18 LAB — RPR: RPR Ser Ql: REACTIVE — AB

## 2022-02-18 LAB — HIV ANTIBODY (ROUTINE TESTING W REFLEX): HIV Screen 4th Generation wRfx: NONREACTIVE

## 2022-02-18 NOTE — Telephone Encounter (Signed)
Per Selena Batten, APP patient will need treatment with 2.4 million units of Bicillin for positive Syphilis.   Attempted to reach patient x 1, unable to LVM [facility] notified

## 2022-02-25 ENCOUNTER — Encounter: Payer: Self-pay | Admitting: Emergency Medicine

## 2022-02-25 ENCOUNTER — Ambulatory Visit
Admission: EM | Admit: 2022-02-25 | Discharge: 2022-02-25 | Disposition: A | Discharge status: Home / Self Care | Payer: Medicaid Other | Attending: Internal Medicine | Admitting: Internal Medicine

## 2022-02-25 DIAGNOSIS — A539 Syphilis, unspecified: Secondary | ICD-10-CM

## 2022-02-25 MED ORDER — PENICILLIN G BENZATHINE 1200000 UNIT/2ML IM SUSY
2.4000 10*6.[IU] | PREFILLED_SYRINGE | Freq: Once | INTRAMUSCULAR | Status: DC
  Administered 2022-02-25: 2.4 10*6.[IU] via INTRAMUSCULAR

## 2022-02-25 NOTE — ED Triage Notes (Signed)
Pt here for treatment for syphilis

## 2022-02-26 ENCOUNTER — Ambulatory Visit: Payer: Medicaid Other

## 2022-04-05 ENCOUNTER — Ambulatory Visit
Admission: EM | Admit: 2022-04-05 | Discharge: 2022-04-05 | Disposition: A | Discharge status: Home / Self Care | Payer: Medicaid Other | Attending: Family Medicine | Admitting: Family Medicine

## 2022-04-05 DIAGNOSIS — N760 Acute vaginitis: Secondary | ICD-10-CM

## 2022-04-05 DIAGNOSIS — Z202 Contact with and (suspected) exposure to infections with a predominantly sexual mode of transmission: Secondary | ICD-10-CM

## 2022-04-05 DIAGNOSIS — R300 Dysuria: Secondary | ICD-10-CM

## 2022-04-05 DIAGNOSIS — Z711 Person with feared health complaint in whom no diagnosis is made: Secondary | ICD-10-CM

## 2022-04-05 DIAGNOSIS — A539 Syphilis, unspecified: Secondary | ICD-10-CM

## 2022-04-05 LAB — POCT URINALYSIS DIP (MANUAL ENTRY)
Bilirubin, UA: NEGATIVE
Blood, UA: NEGATIVE
Glucose, UA: NEGATIVE mg/dL
Ketones, POC UA: NEGATIVE mg/dL
Nitrite, UA: NEGATIVE
Protein Ur, POC: NEGATIVE mg/dL
Spec Grav, UA: 1.02 (ref 1.010–1.025)
Urobilinogen, UA: 0.2 E.U./dL
pH, UA: 7 (ref 5.0–8.0)

## 2022-04-05 LAB — POCT URINE PREGNANCY: Preg Test, Ur: NEGATIVE

## 2022-04-05 NOTE — ED Provider Notes (Signed)
[facility]    CSN: [id number] Arrival date & time: 04/05/22  1401      History   Chief Complaint Chief Complaint  Patient presents with   SEXUALLY TRANSMITTED DISEASE    HPI Michele Vasquez is a 21 y.o. female.   HPI Patient with a recent history of syphilis, recurrent BV, presents today for follow-up testing posttreatment with Bicillin for treatment of syphilis.  She is here to have her titers checked.  She was diagnosed the last week of July and treated on 02/25/22 with Bicillin for syphilis.  She reports that her partner was also tested and treated. She reports that the health department contacted her and advised her to come in for repeat testing.  She is also having vaginitis dysuria symptoms.  She would also like repeat cytology.  She reports no new sexual partners. Patient's last menstrual period was 03/29/2022.   Past Medical History:  Diagnosis Date   Asthma    Eczema    Seasonal allergies     There are no problems to display for this patient.   Past Surgical History:  Procedure Laterality Date   CHOLECYSTECTOMY     TONSILLECTOMY      OB History   No obstetric history on file.      Home Medications    Prior to Admission medications   Medication Sig Start Date End Date Taking? Authorizing Provider  albuterol (VENTOLIN HFA) 108 (90 Base) MCG/ACT inhaler Inhale 1-2 puffs into the lungs every 6 (six) hours as needed for wheezing or shortness of breath. 10/17/20   Rushie Chestnut, PA-C  AZO-CRANBERRY PO Take by mouth.    [provider]  fluconazole (DIFLUCAN) 150 MG tablet Take 1 tablet (150 mg total) by mouth daily. 12/02/21   Gustavus Bryant, FNP  fluticasone (FLONASE) 50 MCG/ACT nasal spray Place 2 sprays into both nostrils daily. 10/17/20   Rushie Chestnut, PA-C  lidocaine (LIDODERM) 5 % Place 1 patch onto the skin daily as needed. Apply patch to area most significant pain once per day.  Remove and discard patch within 12  hours of application. 02/09/22   Petrucelli, Samantha R, PA-C  loratadine (CLARITIN) 10 MG tablet Take 1 tablet (10 mg total) by mouth daily. 10/17/20   Rushie Chestnut, PA-C  metroNIDAZOLE (FLAGYL) 500 MG tablet Take 1 tablet (500 mg total) by mouth 2 (two) times daily. 02/17/22   Lamptey, Britta Mccreedy, MD  montelukast (SINGULAIR) 10 MG tablet Take 1 tablet (10 mg total) by mouth at bedtime. 10/17/20   Rushie Chestnut, PA-C  naproxen (NAPROSYN) 500 MG tablet Take 1 tablet (500 mg total) by mouth 2 (two) times daily as needed for moderate pain. 02/09/22   Petrucelli, Samantha R, PA-C  nitrofurantoin, macrocrystal-monohydrate, (MACROBID) 100 MG capsule Take 1 capsule (100 mg total) by mouth 2 (two) times daily. 12/02/21   Gustavus Bryant, FNP  SUMAtriptan (IMITREX) 50 MG tablet SMARTSIG:1 Tablet(s) By Mouth 1-2 Times Daily 02/08/22   [provider]  dicyclomine (BENTYL) 20 MG tablet Take 1 tablet (20 mg total) by mouth 4 (four) times daily -  before meals and at bedtime. 04/30/20 06/05/20  Wieters, Hallie C, PA-C  esomeprazole (NEXIUM) 40 MG capsule Take 1 capsule (40 mg total) by mouth daily. 05/08/15 04/30/20  Arnaldo Natal, MD  famotidine (PEPCID) 20 MG tablet Take 1 tablet (20 mg total) by mouth 2 (two) times daily. 04/30/20 06/05/20  Wieters, Hallie C, PA-C  omeprazole (  PRILOSEC) 20 MG capsule Take 1 capsule (20 mg total) by mouth 2 (two) times daily before a meal for 15 days. 04/30/20 06/05/20  Wieters, Junius Creamer, PA-C    Family History Family History  Problem Relation Age of Onset   Hypertension Mother    Migraines Mother    Asthma Father    Heart disease Father    Hypertension Father    Breast cancer Maternal Grandmother    Colon cancer Maternal Grandmother    Stomach cancer Maternal Grandmother    Diabetes Maternal Grandmother     Social History Social History   Tobacco Use   Smoking status: Every Day    Packs/day: 0.50    Types: Cigarettes   Smokeless tobacco: Never  Vaping Use    Vaping Use: Never used  Substance Use Topics   Alcohol use: Yes    Comment: soc   Drug use: Not Currently    Types: Marijuana     Allergies   Cat hair extract   Review of Systems Review of Systems Pertinent negatives listed in HPI  Physical Exam Triage Vital Signs ED Triage Vitals  Enc Vitals Group     BP 04/05/22 1443 112/73     Pulse Rate 04/05/22 1443 94     Resp 04/05/22 1443 16     Temp 04/05/22 1443 97.7 F (36.5 C)     Temp Source 04/05/22 1443 Temporal     SpO2 04/05/22 1443 98 %     Weight --      Height --      Head Circumference --      Peak Flow --      Pain Score 04/05/22 1623 0     Pain Loc --      Pain Edu? --      Excl. in GC? --    No data found.  Updated Vital Signs BP 112/73 (BP Location: Left Arm)   Pulse 94   Temp 97.7 F (36.5 C) (Temporal)   Resp 16   LMP 03/29/2022   SpO2 98%   Visual Acuity Right Eye Distance:   Left Eye Distance:   Bilateral Distance:    Right Eye Near:   Left Eye Near:    Bilateral Near:     Physical Exam   General appearance: alert, well developed, well nourished, cooperative and in no distress Head: Normocephalic, without obvious abnormality, atraumatic Respiratory: Respirations even and unlabored, normal respiratory rate Heart: rate and rhythm normal.   Extremities: No gross deformities Skin: Skin color, texture, turgor normal. No rashes seen  Psych: Appropriate mood and affect.  Cytology self collected UC Treatments / Results  Labs (all labs ordered are listed, but only abnormal results are displayed) Labs Reviewed  POCT URINALYSIS DIP (MANUAL ENTRY) - Abnormal; Notable for the following components:      Result Value   Leukocytes, UA Large (3+) (*)    All other components within normal limits  URINE CULTURE  RPR  POCT URINE PREGNANCY  CERVICOVAGINAL ANCILLARY ONLY    EKG   Radiology No results found.  Procedures Procedures (including critical care time)  Medications Ordered in  UC Medications - No data to display  Initial Impression / Assessment and Plan / UC Course  I have reviewed the triage vital signs and the nursing notes.  Pertinent labs & imaging results that were available during my care of the patient were reviewed by me and considered in my medical decision making (see chart for details).  Vaginal cytology pending.  Repeated RPR to check that titers are trending down at least 4 x the initial titer level of 1:64. Patient advised someone from our clinical team will contact her once her results are available to discuss next steps.  Did advise if she is not therapeutic that she referral to the health department and/or infectious disease.  Patient is also experiencing dysuria therefore a UA was collected which showed large amount of leukocytes present.  Urine culture pending.  No treatment prescribed today will await cytology and urine culture results. Final Clinical Impressions(s) / UC Diagnoses   Final diagnoses:  Concern about STD in female without diagnosis  Syphilis  Dysuria  Vaginitis and vulvovaginitis   Discharge Instructions   None    ED Prescriptions   None    PDMP not reviewed this encounter.   Bing Neighbors, FNP 04/05/22 [phone]

## 2022-04-05 NOTE — ED Triage Notes (Signed)
Patient presents to [facility] for STD testing. She was seen and treated 02/25/22 for STDs. Symptoms did not resolve post STD treatment. Continues to have discharge. States that the health department instructed her to come back in for testing.

## 2022-04-07 LAB — URINE CULTURE
Culture: 5000 — AB
Special Requests: NORMAL

## 2022-04-08 LAB — CERVICOVAGINAL ANCILLARY ONLY
Bacterial Vaginitis (gardnerella): POSITIVE — AB
Candida Glabrata: NEGATIVE
Candida Vaginitis: NEGATIVE
Chlamydia: NEGATIVE
Comment: NEGATIVE
Comment: NEGATIVE
Comment: NEGATIVE
Comment: NEGATIVE
Comment: NEGATIVE
Comment: NORMAL
Neisseria Gonorrhea: NEGATIVE
Trichomonas: POSITIVE — AB

## 2022-04-09 ENCOUNTER — Telehealth (HOSPITAL_COMMUNITY): Payer: Self-pay | Admitting: Emergency Medicine

## 2022-04-09 MED ORDER — METRONIDAZOLE 500 MG PO TABS: 500.0000 mg | ORAL_TABLET | Freq: Two times a day (BID) | ORAL | 0 refills | Status: DC

## 2022-04-10 LAB — RPR, QUANT+TP ABS (REFLEX)
Rapid Plasma Reagin, Quant: 1:32 {titer} — ABNORMAL HIGH
T Pallidum Abs: REACTIVE — AB

## 2022-04-10 LAB — RPR: RPR Ser Ql: REACTIVE — AB

## 2022-08-01 ENCOUNTER — Ambulatory Visit: Payer: Self-pay

## 2023-04-29 ENCOUNTER — Ambulatory Visit: Payer: Self-pay

## 2023-05-13 ENCOUNTER — Telehealth: Payer: Self-pay

## 2023-05-13 ENCOUNTER — Ambulatory Visit: Payer: Medicaid Other | Admitting: Nurse Practitioner

## 2023-05-13 NOTE — Telephone Encounter (Signed)
New pt no show/no letter sent/medicaid/pt blocked for future schedule

## 2023-06-15 ENCOUNTER — Ambulatory Visit: Payer: Medicaid Other

## 2023-06-17 ENCOUNTER — Ambulatory Visit
Admission: RE | Admit: 2023-06-17 | Discharge: 2023-06-17 | Disposition: A | Discharge status: Home / Self Care | Payer: Medicaid Other | Source: Ambulatory Visit | Attending: Internal Medicine

## 2023-06-17 VITALS — BP 109/71 | HR 86 | Temp 98.4°F | Resp 17

## 2023-06-17 DIAGNOSIS — J209 Acute bronchitis, unspecified: Secondary | ICD-10-CM | POA: Diagnosis not present

## 2023-06-17 DIAGNOSIS — R300 Dysuria: Secondary | ICD-10-CM | POA: Insufficient documentation

## 2023-06-17 DIAGNOSIS — Z113 Encounter for screening for infections with a predominantly sexual mode of transmission: Secondary | ICD-10-CM | POA: Insufficient documentation

## 2023-06-17 DIAGNOSIS — R102 Pelvic and perineal pain: Secondary | ICD-10-CM | POA: Insufficient documentation

## 2023-06-17 LAB — POCT URINALYSIS DIP (MANUAL ENTRY)
Bilirubin, UA: NEGATIVE
Blood, UA: NEGATIVE
Glucose, UA: NEGATIVE mg/dL
Ketones, POC UA: NEGATIVE mg/dL
Nitrite, UA: NEGATIVE
Protein Ur, POC: NEGATIVE mg/dL
Spec Grav, UA: 1.02 — AB (ref 1.010–1.025)
Urobilinogen, UA: 0.2 U/dL
pH, UA: 7 (ref 5.0–8.0)

## 2023-06-17 MED ORDER — PREDNISONE 20 MG PO TABS: ORAL_TABLET | ORAL | 0 refills | Status: DC

## 2023-06-17 MED ORDER — ALBUTEROL SULFATE HFA 108 (90 BASE) MCG/ACT IN AERS: 1.0000 | INHALATION_SPRAY | Freq: Four times a day (QID) | RESPIRATORY_TRACT | 0 refills | Status: DC | PRN

## 2023-06-17 MED ORDER — PROMETHAZINE-DM 6.25-15 MG/5ML PO SYRP: 5.0000 mL | ORAL_SOLUTION | Freq: Three times a day (TID) | ORAL | 0 refills | Status: DC | PRN

## 2023-06-17 NOTE — Discharge Instructions (Addendum)
Please start prednisone and albuterol to address the bronchitis. Make sure you hydrate very well with plain water and a quantity of 80 ounces of water a day.  Please limit drinks that are considered urinary irritants such as soda, sweet tea, coffee, energy drinks, alcohol.  These can worsen your urinary and genital symptoms but also be the source of them.  I will let you know about your urine culture, STI test results through MyChart to see if we need to prescribe or change your antibiotics based off of those results.

## 2023-06-17 NOTE — ED Triage Notes (Signed)
Pt c/o of abdominal pains, urine is dark for over a week. Tried taking AZO. Denies n/d or bowel problems  Pt reports that on inhaler and having to use it more than normal over the past 2 weeks. Reports congestion and has cough that is productive. Taking Mucinex that is not helping.   Pt reports as positive for Syphilis about 6 months ago. Wanting blood work to retest to check her levels.

## 2023-06-17 NOTE — ED Provider Notes (Signed)
[facility]  Note:  This document was prepared using Conservation officer, historic buildings and may include unintentional dictation errors.  MRN: [id number] DOB: [birth year]  Subjective:   Michele Vasquez is a 22 y.o. female presenting for multiple concerns.  Reports 2-week history of persistent chest congestion, coughing, wheezing, shortness of breath, productive cough.  Has been using her inhaler more consistently.  Has also use supportive care. Would like an STI check.  She tested positive for syphilis 6 months ago.  She was treated but wants to repeat testing.  Denies fever, n/v, abdominal pain, rashes, dysuria, urinary frequency, hematuria, vaginal discharge.   Reports 1 week history of intermittent abdominal cramps, dark urine.  No fever, recent antibiotic use, bloody stools, hospitalizations or long distance travel.  Has not eaten raw foods, drank unfiltered water.  No history of GI disorders including Crohn's, IBS, ulcerative colitis.  Does not hydrate very consistently with water.  She also drinks sodas, sweet tea regularly.  No current facility-administered medications for this encounter.  Current Outpatient Medications:    albuterol (VENTOLIN HFA) 108 (90 Base) MCG/ACT inhaler, Inhale 1-2 puffs into the lungs every 6 (six) hours as needed for wheezing or shortness of breath., Disp: 18 g, Rfl: 0   AZO-CRANBERRY PO, Take by mouth., Disp: , Rfl:    cetirizine (ZYRTEC) 10 MG tablet, Take 10 mg by mouth daily., Disp: , Rfl:    fluconazole (DIFLUCAN) 150 MG tablet, Take 1 tablet (150 mg total) by mouth daily., Disp: 1 tablet, Rfl: 0   fluticasone (FLONASE) 50 MCG/ACT nasal spray, Place 2 sprays into both nostrils daily., Disp: 16 g, Rfl: 0   lidocaine (LIDODERM) 5 %, Place 1 patch onto the skin daily as needed. Apply patch to area most significant pain once per day.  Remove and discard patch within 12 hours of application., Disp: 15 patch, Rfl: 0   loratadine  (CLARITIN) 10 MG tablet, Take 1 tablet (10 mg total) by mouth daily., Disp: 30 tablet, Rfl: 0   metroNIDAZOLE (FLAGYL) 500 MG tablet, Take 1 tablet (500 mg total) by mouth 2 (two) times daily., Disp: 14 tablet, Rfl: 0   montelukast (SINGULAIR) 10 MG tablet, Take 1 tablet (10 mg total) by mouth at bedtime., Disp: 30 tablet, Rfl: 0   naproxen (NAPROSYN) 500 MG tablet, Take 1 tablet (500 mg total) by mouth 2 (two) times daily as needed for moderate pain., Disp: 15 tablet, Rfl: 0   nitrofurantoin, macrocrystal-monohydrate, (MACROBID) 100 MG capsule, Take 1 capsule (100 mg total) by mouth 2 (two) times daily., Disp: 10 capsule, Rfl: 0   SUMAtriptan (IMITREX) 50 MG tablet, SMARTSIG:1 Tablet(s) By Mouth 1-2 Times Daily, Disp: , Rfl:    Allergies  Allergen Reactions   Cat Hair Extract Other (See Comments)    Asthma flares up    Past Medical History:  Diagnosis Date   Asthma    Eczema    Seasonal allergies      Past Surgical History:  Procedure Laterality Date   CHOLECYSTECTOMY     TONSILLECTOMY      Family History  Problem Relation Age of Onset   Hypertension Mother    Migraines Mother    Asthma Father    Heart disease Father    Hypertension Father    Breast cancer Maternal Grandmother    Colon cancer Maternal Grandmother    Stomach cancer Maternal Grandmother    Diabetes Maternal Grandmother     Social History  Tobacco Use   Smoking status: Every Day    Current packs/day: 0.50    Types: Cigarettes   Smokeless tobacco: Never  Vaping Use   Vaping status: Never Used  Substance Use Topics   Alcohol use: Yes    Comment: soc   Drug use: Not Currently    Types: Marijuana    ROS   Objective:   Vitals: BP 109/71 (BP Location: Right Arm)   Pulse 86   Temp 98.4 F (36.9 C) (Oral)   Resp 17   LMP 06/14/2023   SpO2 98%   Physical Exam Constitutional:      General: She is not in acute distress.    Appearance: Normal appearance. She is well-developed and normal  weight. She is not ill-appearing, toxic-appearing or diaphoretic.  HENT:     Head: Normocephalic and atraumatic.     Right Ear: Tympanic membrane, ear canal and external ear normal. No drainage or tenderness. No middle ear effusion. There is no impacted cerumen. Tympanic membrane is not erythematous or bulging.     Left Ear: Tympanic membrane, ear canal and external ear normal. No drainage or tenderness.  No middle ear effusion. There is no impacted cerumen. Tympanic membrane is not erythematous or bulging.     Nose: Nose normal. No congestion or rhinorrhea.     Mouth/Throat:     Mouth: Mucous membranes are moist. No oral lesions.     Pharynx: No pharyngeal swelling, oropharyngeal exudate, posterior oropharyngeal erythema or uvula swelling.     Tonsils: No tonsillar exudate or tonsillar abscesses.  Eyes:     General: No scleral icterus.       Right eye: No discharge.        Left eye: No discharge.     Extraocular Movements: Extraocular movements intact.     Right eye: Normal extraocular motion.     Left eye: Normal extraocular motion.     Conjunctiva/sclera: Conjunctivae normal.  Cardiovascular:     Rate and Rhythm: Normal rate and regular rhythm.     Heart sounds: Normal heart sounds. No murmur heard.    No friction rub. No gallop.  Pulmonary:     Effort: Pulmonary effort is normal. No respiratory distress.     Breath sounds: No stridor. Rhonchi present. No wheezing or rales.  Chest:     Chest wall: No tenderness.  Abdominal:     General: Bowel sounds are normal. There is no distension.     Palpations: Abdomen is soft. There is no mass.     Tenderness: There is no abdominal tenderness. There is no right CVA tenderness, left CVA tenderness, guarding or rebound.  Musculoskeletal:     Cervical back: Normal range of motion and neck supple.  Lymphadenopathy:     Cervical: No cervical adenopathy.  Skin:    General: Skin is warm and dry.  Neurological:     General: No focal deficit  present.     Mental Status: She is alert and oriented to person, place, and time.  Psychiatric:        Mood and Affect: Mood normal.        Behavior: Behavior normal.        Thought Content: Thought content normal.        Judgment: Judgment normal.     Results for orders placed or performed during the hospital encounter of 06/17/23 (from the past 24 hour(s))  POCT urinalysis dipstick     Status: Abnormal   Collection Time:  06/17/23  2:08 PM  Result Value Ref Range   Color, UA yellow (A) yellow   Clarity, UA cloudy (A) clear   Glucose, UA negative negative mg/dL   Bilirubin, UA negative negative   Ketones, POC UA negative negative mg/dL   Spec Grav, UA 2.130 (A) 1.010 - 1.025   Blood, UA negative negative   pH, UA 7.0 5.0 - 8.0   Protein Ur, POC negative negative mg/dL   Urobilinogen, UA 0.2 0.2 or 1.0 E.U./dL   Nitrite, UA Negative Negative   Leukocytes, UA Small (1+) (A) Negative    Assessment and Plan :   PDMP not reviewed this encounter.  1. Acute bronchitis, unspecified organism   2. Screen for STD (sexually transmitted disease)   3. Acute pelvic pain, female   4. Dysuria    Recommended managing for bronchitis with prednisone, provided albuterol refill.  Use supportive care otherwise.  Emphasized hydrating more consistently and avoiding urinary irritants.  Complete STI check pending, urine culture pending.  Counseled patient on potential for adverse effects with medications prescribed/recommended today, ER and return-to-clinic precautions discussed, patient verbalized understanding.    Wallis Bamberg, PA-C 06/17/23 1724

## 2023-06-18 LAB — CERVICOVAGINAL ANCILLARY ONLY
Bacterial Vaginitis (gardnerella): POSITIVE — AB
Chlamydia: NEGATIVE
Comment: NEGATIVE
Comment: NEGATIVE
Comment: NEGATIVE
Comment: NORMAL
Neisseria Gonorrhea: NEGATIVE
Trichomonas: NEGATIVE

## 2023-06-19 LAB — RPR, QUANT+TP ABS (REFLEX)
Rapid Plasma Reagin, Quant: 1:8 {titer} — ABNORMAL HIGH
T Pallidum Abs: REACTIVE — AB

## 2023-06-19 LAB — RPR: RPR Ser Ql: REACTIVE — AB

## 2023-06-19 LAB — URINE CULTURE: Culture: 3000 — AB

## 2023-06-19 LAB — HIV ANTIBODY (ROUTINE TESTING W REFLEX): HIV Screen 4th Generation wRfx: NONREACTIVE

## 2023-08-04 ENCOUNTER — Encounter: Payer: Self-pay | Admitting: Emergency Medicine

## 2023-08-04 ENCOUNTER — Ambulatory Visit
Admission: EM | Admit: 2023-08-04 | Discharge: 2023-08-04 | Disposition: A | Discharge status: Home / Self Care | Payer: Medicaid Other | Attending: Family Medicine | Admitting: Family Medicine

## 2023-08-04 DIAGNOSIS — B86 Scabies: Secondary | ICD-10-CM | POA: Diagnosis not present

## 2023-08-04 DIAGNOSIS — L299 Pruritus, unspecified: Secondary | ICD-10-CM | POA: Diagnosis not present

## 2023-08-04 MED ORDER — PERMETHRIN 5 % EX CREA
TOPICAL_CREAM | CUTANEOUS | 0 refills | Status: DC
Start: 2023-08-04 — End: 2023-08-06

## 2023-08-04 MED ORDER — HYDROXYZINE HCL 25 MG PO TABS: 12.5000 mg | ORAL_TABLET | Freq: Three times a day (TID) | ORAL | 0 refills | Status: AC | PRN

## 2023-08-04 NOTE — ED Triage Notes (Signed)
 Patient c/o rash that has appeared on her chest, arms, back and groin x 2 days.  The rash is extremely itchy.  Patient has applied Calimine and hydrocortisone cream.  Denies any new soaps, lotions or detergents.  Stayed at a hotel the night before she broke out.

## 2023-08-04 NOTE — ED Provider Notes (Signed)
 [facility] URG[facility]  Note:  This document was prepared using Conservation officer, historic buildings and may include unintentional dictation errors.  MRN: [id number] DOB: 2000/10/29  Subjective:   Alaijah Gibler is a 22 y.o. female presenting for 2-day history of a persistent pruritic rash over the chest, arms, lateral hips.  Symptoms started after she spent a night in a hotel with her boyfriend.  He has had similar lesions over his chest.  No current facility-administered medications for this encounter.  Current Outpatient Medications:    albuterol  (VENTOLIN  HFA) 108 (90 Base) MCG/ACT inhaler, Inhale 1-2 puffs into the lungs every 6 (six) hours as needed for wheezing or shortness of breath., Disp: 18 g, Rfl: 0   AZO-CRANBERRY PO, Take by mouth., Disp: , Rfl:    cetirizine (ZYRTEC) 10 MG tablet, Take 10 mg by mouth daily., Disp: , Rfl:    fluconazole  (DIFLUCAN ) 150 MG tablet, Take 1 tablet (150 mg total) by mouth daily., Disp: 1 tablet, Rfl: 0   fluticasone  (FLONASE ) 50 MCG/ACT nasal spray, Place 2 sprays into both nostrils daily., Disp: 16 g, Rfl: 0   lidocaine  (LIDODERM ) 5 %, Place 1 patch onto the skin daily as needed. Apply patch to area most significant pain once per day.  Remove and discard patch within 12 hours of application., Disp: 15 patch, Rfl: 0   loratadine  (CLARITIN ) 10 MG tablet, Take 1 tablet (10 mg total) by mouth daily., Disp: 30 tablet, Rfl: 0   metroNIDAZOLE  (FLAGYL ) 500 MG tablet, Take 1 tablet (500 mg total) by mouth 2 (two) times daily., Disp: 14 tablet, Rfl: 0   montelukast  (SINGULAIR ) 10 MG tablet, Take 1 tablet (10 mg total) by mouth at bedtime., Disp: 30 tablet, Rfl: 0   naproxen  (NAPROSYN ) 500 MG tablet, Take 1 tablet (500 mg total) by mouth 2 (two) times daily as needed for moderate pain., Disp: 15 tablet, Rfl: 0   nitrofurantoin , macrocrystal-monohydrate, (MACROBID ) 100 MG capsule, Take 1 capsule (100 mg total) by mouth 2 (two) times daily.,  Disp: 10 capsule, Rfl: 0   predniSONE  (DELTASONE ) 20 MG tablet, Take 2 tablets daily with breakfast., Disp: 10 tablet, Rfl: 0   promethazine -dextromethorphan (PROMETHAZINE -DM) 6.25-15 MG/5ML syrup, Take 5 mLs by mouth 3 (three) times daily as needed for cough., Disp: 200 mL, Rfl: 0   SUMAtriptan (IMITREX) 50 MG tablet, SMARTSIG:1 Tablet(s) By Mouth 1-2 Times Daily, Disp: , Rfl:    Allergies  Allergen Reactions   Cat Hair Extract Other (See Comments)    Asthma flares up    Past Medical History:  Diagnosis Date   Asthma    Eczema    Seasonal allergies      Past Surgical History:  Procedure Laterality Date   CHOLECYSTECTOMY     TONSILLECTOMY      Family History  Problem Relation Age of Onset   Hypertension Mother    Migraines Mother    Asthma Father    Heart disease Father    Hypertension Father    Breast cancer Maternal Grandmother    Colon cancer Maternal Grandmother    Stomach cancer Maternal Grandmother    Diabetes Maternal Grandmother     Social History   Tobacco Use   Smoking status: Every Day    Current packs/day: 0.50    Types: Cigarettes   Smokeless tobacco: Never  Vaping Use   Vaping status: Never Used  Substance Use Topics   Alcohol use: Yes    Comment: soc  Drug use: Not Currently    Types: Marijuana    ROS   Objective:   Vitals: BP 106/69 (BP Location: Left Arm)   Pulse 90   Temp 98.8 F (37.1 C) (Oral)   Resp 18   Ht 5' (1.524 m)   Wt 192 lb (87.1 kg)   LMP 08/01/2023 (Exact Date)   SpO2 96%   BMI 37.50 kg/m   Physical Exam Constitutional:      General: She is not in acute distress.    Appearance: Normal appearance. She is well-developed. She is not ill-appearing, toxic-appearing or diaphoretic.  HENT:     Head: Normocephalic and atraumatic.     Nose: Nose normal.     Mouth/Throat:     Mouth: Mucous membranes are moist.  Eyes:     General: No scleral icterus.       Right eye: No discharge.        Left eye: No discharge.      Extraocular Movements: Extraocular movements intact.  Cardiovascular:     Rate and Rhythm: Normal rate.  Pulmonary:     Effort: Pulmonary effort is normal.  Skin:    General: Skin is warm and dry.     Comments: Multiple excoriations in linear orientation no clusters scattered over the lateral torso, thighs, arms.  Neurological:     General: No focal deficit present.     Mental Status: She is alert and oriented to person, place, and time.  Psychiatric:        Mood and Affect: Mood normal.        Behavior: Behavior normal.     Assessment and Plan :   PDMP not reviewed this encounter.  1. Scabies   2. Itching    Will cover for scabies with permethrin  cream, hydroxyzine  for itching.  Counseled patient on potential for adverse effects with medications prescribed/recommended today, ER and return-to-clinic precautions discussed, patient verbalized understanding.    Christopher Savannah, PA-C 08/04/23 1721

## 2023-08-05 ENCOUNTER — Ambulatory Visit (HOSPITAL_COMMUNITY): Payer: Medicaid Other

## 2023-08-06 ENCOUNTER — Telehealth: Payer: Medicaid Other | Admitting: Family Medicine

## 2023-08-06 DIAGNOSIS — B86 Scabies: Secondary | ICD-10-CM | POA: Diagnosis not present

## 2023-08-06 DIAGNOSIS — L309 Dermatitis, unspecified: Secondary | ICD-10-CM | POA: Diagnosis not present

## 2023-08-06 MED ORDER — PERMETHRIN 5 % EX CREA
TOPICAL_CREAM | CUTANEOUS | 0 refills | Status: DC
Start: 2023-08-06 — End: 2024-05-15

## 2023-08-06 MED ORDER — TRIAMCINOLONE ACETONIDE 0.025 % EX OINT: 1.0000 | TOPICAL_OINTMENT | Freq: Two times a day (BID) | CUTANEOUS | 0 refills | Status: AC

## 2023-08-06 NOTE — Patient Instructions (Addendum)
 Michele Vasquez, thank you for joining Michele [name], NP for today's virtual visit.  While this provider is not your primary care provider (PCP), if your PCP is located in our provider database this encounter information will be shared with them immediately following your visit.   A [facility] MyChart account gives you access to today's visit and all your visits, tests, and labs performed at [facility]  click here if you don't have a [facility] MyChart account or go to [email or web address]  Consent: (Patient) Michele Vasquez provided verbal consent for this virtual visit at the beginning of the encounter.  Current Medications:  Current Outpatient Medications:    triamcinolone  (KENALOG ) 0.025 % ointment, Apply 1 Application topically 2 (two) times daily., Disp: 30 g, Rfl: 0   albuterol  (VENTOLIN  HFA) 108 (90 Base) MCG/ACT inhaler, Inhale 1-2 puffs into the lungs every 6 (six) hours as needed for wheezing or shortness of breath., Disp: 18 g, Rfl: 0   AZO-CRANBERRY PO, Take by mouth., Disp: , Rfl:    cetirizine (ZYRTEC) 10 MG tablet, Take 10 mg by mouth daily., Disp: , Rfl:    fluconazole  (DIFLUCAN ) 150 MG tablet, Take 1 tablet (150 mg total) by mouth daily., Disp: 1 tablet, Rfl: 0   fluticasone  (FLONASE ) 50 MCG/ACT nasal spray, Place 2 sprays into both nostrils daily., Disp: 16 g, Rfl: 0   hydrOXYzine  (ATARAX ) 25 MG tablet, Take 0.5-1 tablets (12.5-25 mg total) by mouth every 8 (eight) hours as needed for itching., Disp: 30 tablet, Rfl: 0   lidocaine  (LIDODERM ) 5 %, Place 1 patch onto the skin daily as needed. Apply patch to area most significant pain once per day.  Remove and discard patch within 12 hours of application., Disp: 15 patch, Rfl: 0   loratadine  (CLARITIN ) 10 MG tablet, Take 1 tablet (10 mg total) by mouth daily., Disp: 30 tablet, Rfl: 0   metroNIDAZOLE  (FLAGYL ) 500 MG tablet, Take 1 tablet (500 mg total) by mouth 2 (two) times daily., Disp:  14 tablet, Rfl: 0   montelukast  (SINGULAIR ) 10 MG tablet, Take 1 tablet (10 mg total) by mouth at bedtime., Disp: 30 tablet, Rfl: 0   naproxen  (NAPROSYN ) 500 MG tablet, Take 1 tablet (500 mg total) by mouth 2 (two) times daily as needed for moderate pain., Disp: 15 tablet, Rfl: 0   nitrofurantoin , macrocrystal-monohydrate, (MACROBID ) 100 MG capsule, Take 1 capsule (100 mg total) by mouth 2 (two) times daily., Disp: 10 capsule, Rfl: 0   permethrin  (ELIMITE ) 5 % cream, Thoroughly massage cream (30 g for average adult) from head to soles of feet; leave on for 8 to 14 hours before removing (shower or bath)., Disp: 60 g, Rfl: 0   predniSONE  (DELTASONE ) 20 MG tablet, Take 2 tablets daily with breakfast., Disp: 10 tablet, Rfl: 0   promethazine -dextromethorphan (PROMETHAZINE -DM) 6.25-15 MG/5ML syrup, Take 5 mLs by mouth 3 (three) times daily as needed for cough., Disp: 200 mL, Rfl: 0   SUMAtriptan (IMITREX) 50 MG tablet, SMARTSIG:1 Tablet(s) By Mouth 1-2 Times Daily, Disp: , Rfl:    Medications ordered in this encounter:  Meds ordered this encounter  Medications   permethrin  (ELIMITE ) 5 % cream    Sig: Thoroughly massage cream (30 g for average adult) from head to soles of feet; leave on for 8 to 14 hours before removing (shower or bath).    Dispense:  60 g    Refill:  0    Supervising Provider:   BLAISE ALEENE Vasquez [phone]  triamcinolone  (KENALOG ) 0.025 % ointment    Sig: Apply 1 Application topically 2 (two) times daily.    Dispense:  30 g    Refill:  0    Supervising Provider:   BLAISE ALEENE Vasquez [[id number]]     *If you need refills on other medications prior to your next appointment, please contact your pharmacy*  Follow-Up: Call back or seek an in-person evaluation if the symptoms worsen or if the condition fails to improve as anticipated.   Virtual Care [phone]  Other Instructions Scabies, Adult  Scabies is a skin condition that happens when very small insects called  mites get under your skin. This causes severe itchiness and a rash that looks like pimples. Scabies is contagious. This means it can spread easily from person to person. If you get scabies, the people you live with may get it too. With the right treatment, symptoms often go away in 2-4 weeks. In most cases, scabies does not cause lasting problems. What are the causes? Scabies is caused by tiny mites (Sarcoptes scabiei) that can only be seen with a microscope. The mites get into the top layer of your skin and lay eggs. This is called an infestation. You may get scabies if: You have close contact with someone who has scabies. You come in contact with items that have the mites on them. These may include towels, bedding, or clothes. What increases the risk? You may be more likely to get scabies if: You live in a nursing home or extended care facility. You spend time in a place where a lot of people live close together, such as a shelter or prison. You have sex with a partner who has scabies. You care for others who are at risk for scabies. What are the signs or symptoms? Symptoms of scabies include: A rash that looks like pimples. It may include tiny red bumps or blisters. It is often found in the skinfolds or on the hands, wrists, elbows, armpits, chest, waist, groin, or buttocks. Severe itchiness. This is often worse at night. Skin irritation. This can include scaly patches or sores. The bumps from scabies may form a line (burrow) on the skin. The line may look thin, crooked, and grayish-white or skin colored. How is this diagnosed? Scabies may be diagnosed based on a physical exam of your skin. You may also have a skin test done. A sample of your skin may be taken (skin scraping) and looked at under a microscope for signs of mites. How is this treated? Scabies may be treated with: Medicated creams or lotions to kill the mites. The cream or lotion is spread on your whole body and left for a few  hours. In most cases, one treatment is enough to kill all the mites. In severe cases, the treatment may need to be done more than once. Medicated cream to help with the itching. Medicines taken by mouth (orally). These may help: Relieve itching. Reduce the swelling and redness. Kill the mites. This treatment may be used in severe cases. Follow these instructions at home: Medicines Take or apply over-the-counter and prescription medicines only as told by your health care provider. Apply medicated cream or lotion as told by your provider. Do not wash off the medicated cream or lotion until enough time has passed or as told by your provider. Skin care Try not to scratch or pick at the affected areas of your skin. Keep your fingernails closely trimmed. This can help reduce injury from  scratching. Take cool baths or apply cool, wet cloths to your skin. This can help reduce itching. General instructions Clean all items that you touched in the 3 days before you were diagnosed. This includes bedding, clothes, towels, and furniture. Do this on the same day that you start treatment. Dry-clean items or use hot water to wash them. Dry them on the hot dry cycle. Place items that cannot be washed into closed, airtight plastic bags for at least 3 days. The mites cannot live for more than 3 days away from human skin. Vacuum your furniture and mattresses. Make sure that other people who may have been infested see a provider. Where to find more information Centers for Disease Control and Prevention (CDC): tonerpromos.no Contact a health care provider if: You have itching that does not go away after 4 weeks of treatment. You keep getting new bumps or burrows. You have redness, swelling, or pain near your rash after treatment. You have fluid, blood, or pus coming from your rash. You get thick crusts or scaly patches over large areas of your skin. You have a fever. This information is not intended to replace  advice given to you by your health care provider. Make sure you discuss any questions you have with your health care provider. Document Revised: 04/28/2022 Document Reviewed: 04/28/2022 Elsevier Patient Education  2024 Elsevier Inc.    If you have been instructed to have an in-person evaluation today at a local Urgent Care facility, please use the link below. It will take you to a list of all of our available [facility], including address, phone number and hours of operation. Please do not delay care.  [facility]  If you or a family member do not have a primary care provider, use the link below to schedule a visit and establish care. When you choose a [facility] primary care physician or advanced practice provider, you gain a long-term partner in health. Find a Primary Care Provider  Learn more about [facility]'s in-office and virtual care options: [facility] - Get Care Now

## 2023-08-06 NOTE — Progress Notes (Signed)
 Virtual Visit Consent   Michele Vasquez, you are scheduled for a virtual visit with a [facility] provider today. Just as with appointments in the office, your consent must be obtained to participate. Your consent will be active for this visit and any virtual visit you may have with one of our providers in the next 365 days. If you have a MyChart account, a copy of this consent can be sent to you electronically.  As this is a virtual visit, video technology does not allow for your provider to perform a traditional examination. This may limit your provider's ability to fully assess your condition. If your provider identifies any concerns that need to be evaluated in person or the need to arrange testing (such as labs, EKG, etc.), we will make arrangements to do so. Although advances in technology are sophisticated, we cannot ensure that it will always work on either your end or our end. If the connection with a video visit is poor, the visit may have to be switched to a telephone visit. With either a video or telephone visit, we are not always able to ensure that we have a secure connection.  By engaging in this virtual visit, you consent to the provision of healthcare and authorize for your insurance to be billed (if applicable) for the services provided during this visit. Depending on your insurance coverage, you may receive a charge related to this service.  I need to obtain your verbal consent now. Are you willing to proceed with your visit today? Michele Vasquez has provided verbal consent on 08/06/2023 for a virtual visit (video or telephone). Michele [name], NP  Date: 08/06/2023 7:26 PM  Virtual Visit via Video Note   I, Michele Vasquez, connected with  Michele Vasquez  ([id number], July 01, 2001) on 08/06/23 at  7:30 PM EST by a video-enabled telemedicine application and verified that I am speaking with the correct person using two identifiers.  Location: Patient:  Virtual Visit Location Patient: Home Provider: Virtual Visit Location Provider: Home Office   I discussed the limitations of evaluation and management by telemedicine and the availability of in person appointments. The patient expressed understanding and agreed to proceed.    History of Present Illness: Michele Vasquez is a 23 y.o. who identifies as a female who was assigned female at birth, and is being seen today for on going scabies and itching.  Was seen on 08/04/23 for scabies when presenting to UC for a persistent pruritic rash over the chest, arms, and lateral hips. Symptoms started after she stayed the night in a hotel with her boyfriend. She also had to treat her dog yesterday for them.   Reports needing more of the permethrin  due to a bigger body habitus.  Also is reporting an ezema flare due to the winter dry air, and she usually gets ordered kenalog  for this. Has not been to a PCP in a while for treatment.    Denies other concerns or issues.   Problems: There are no active problems to display for this patient.   Allergies:  Allergies  Allergen Reactions   Cat Hair Extract Other (See Comments)    Asthma flares up   Medications:  Current Outpatient Medications:    triamcinolone  (KENALOG ) 0.025 % ointment, Apply 1 Application topically 2 (two) times daily., Disp: 30 g, Rfl: 0   albuterol  (VENTOLIN  HFA) 108 (90 Base) MCG/ACT inhaler, Inhale 1-2 puffs into the lungs every 6 (six) hours as needed for wheezing  or shortness of breath., Disp: 18 g, Rfl: 0   AZO-CRANBERRY PO, Take by mouth., Disp: , Rfl:    cetirizine (ZYRTEC) 10 MG tablet, Take 10 mg by mouth daily., Disp: , Rfl:    fluconazole  (DIFLUCAN ) 150 MG tablet, Take 1 tablet (150 mg total) by mouth daily., Disp: 1 tablet, Rfl: 0   fluticasone  (FLONASE ) 50 MCG/ACT nasal spray, Place 2 sprays into both nostrils daily., Disp: 16 g, Rfl: 0   hydrOXYzine  (ATARAX ) 25 MG tablet, Take 0.5-1 tablets (12.5-25 mg total) by  mouth every 8 (eight) hours as needed for itching., Disp: 30 tablet, Rfl: 0   lidocaine  (LIDODERM ) 5 %, Place 1 patch onto the skin daily as needed. Apply patch to area most significant pain once per day.  Remove and discard patch within 12 hours of application., Disp: 15 patch, Rfl: 0   loratadine  (CLARITIN ) 10 MG tablet, Take 1 tablet (10 mg total) by mouth daily., Disp: 30 tablet, Rfl: 0   metroNIDAZOLE  (FLAGYL ) 500 MG tablet, Take 1 tablet (500 mg total) by mouth 2 (two) times daily., Disp: 14 tablet, Rfl: 0   montelukast  (SINGULAIR ) 10 MG tablet, Take 1 tablet (10 mg total) by mouth at bedtime., Disp: 30 tablet, Rfl: 0   naproxen  (NAPROSYN ) 500 MG tablet, Take 1 tablet (500 mg total) by mouth 2 (two) times daily as needed for moderate pain., Disp: 15 tablet, Rfl: 0   nitrofurantoin , macrocrystal-monohydrate, (MACROBID ) 100 MG capsule, Take 1 capsule (100 mg total) by mouth 2 (two) times daily., Disp: 10 capsule, Rfl: 0   permethrin  (ELIMITE ) 5 % cream, Thoroughly massage cream (30 g for average adult) from head to soles of feet; leave on for 8 to 14 hours before removing (shower or bath)., Disp: 60 g, Rfl: 0   predniSONE  (DELTASONE ) 20 MG tablet, Take 2 tablets daily with breakfast., Disp: 10 tablet, Rfl: 0   promethazine -dextromethorphan (PROMETHAZINE -DM) 6.25-15 MG/5ML syrup, Take 5 mLs by mouth 3 (three) times daily as needed for cough., Disp: 200 mL, Rfl: 0   SUMAtriptan (IMITREX) 50 MG tablet, SMARTSIG:1 Tablet(s) By Mouth 1-2 Times Daily, Disp: , Rfl:   Observations/Objective: Patient is well-developed, well-nourished in no acute distress.  Resting comfortably  at home.  Head is normocephalic, atraumatic.  No labored breathing.  Speech is clear and coherent with logical content.  Patient is alert and oriented at baseline.    Assessment and Plan: 1. Scabies (Primary) - permethrin  (ELIMITE ) 5 % cream; Thoroughly massage cream (30 g for average adult) from head to soles of feet; leave  on for 8 to 14 hours before removing (shower or bath).  Dispense: 60 g; Refill: 0  2. Eczema, unspecified type - triamcinolone  (KENALOG ) 0.025 % ointment; Apply 1 Application topically 2 (two) times daily.  Dispense: 30 g; Refill: 0  -apply cream as discussed -wash all clothing in hot water that was exposed -vacuum  -scabies info on AVS   -follow up if not improving.   Contact PCP for on going care of dry skin/ezcema  Reviewed side effects, risks and benefits of medication.    Patient acknowledged agreement and understanding of the plan.   Past Medical, Surgical, Social History, Allergies, and Medications have been Reviewed.   Follow Up Instructions: I discussed the assessment and treatment plan with the patient. The patient was provided an opportunity to ask questions and all were answered. The patient agreed with the plan and demonstrated an understanding of the instructions.  A copy of instructions were  sent to the patient via MyChart unless otherwise noted below.    The patient was advised to call back or seek an in-person evaluation if the symptoms worsen or if the condition fails to improve as anticipated.    Michele [name], NP

## 2023-10-24 ENCOUNTER — Telehealth: Admitting: Family Medicine

## 2023-10-24 DIAGNOSIS — B009 Herpesviral infection, unspecified: Secondary | ICD-10-CM | POA: Diagnosis not present

## 2023-10-24 MED ORDER — VALACYCLOVIR HCL 1 G PO TABS
1000.0000 mg | ORAL_TABLET | Freq: Two times a day (BID) | ORAL | 0 refills | Status: AC
Start: 2023-10-24 — End: 2023-11-03

## 2023-10-24 NOTE — Patient Instructions (Signed)
 Michele Vasquez, thank you for joining Reed Pandy, PA-C for today's virtual visit.  While this provider is not your primary care provider (PCP), if your PCP is located in our provider database this encounter information will be shared with them immediately following your visit.   A [facility] MyChart account gives you access to today's visit and all your visits, tests, and labs performed at [facility] " click here if you don't have a [facility] MyChart account or go to [email or web address]  Consent: (Patient) Michele Vasquez provided verbal consent for this virtual visit at the beginning of the encounter.  Current Medications:  Current Outpatient Medications:    valACYclovir (VALTREX) 1000 MG tablet, Take 1 tablet (1,000 mg total) by mouth 2 (two) times daily for 10 days., Disp: 20 tablet, Rfl: 0   albuterol (VENTOLIN HFA) 108 (90 Base) MCG/ACT inhaler, Inhale 1-2 puffs into the lungs every 6 (six) hours as needed for wheezing or shortness of breath., Disp: 18 g, Rfl: 0   AZO-CRANBERRY PO, Take by mouth., Disp: , Rfl:    cetirizine (ZYRTEC) 10 MG tablet, Take 10 mg by mouth daily., Disp: , Rfl:    fluconazole (DIFLUCAN) 150 MG tablet, Take 1 tablet (150 mg total) by mouth daily., Disp: 1 tablet, Rfl: 0   fluticasone (FLONASE) 50 MCG/ACT nasal spray, Place 2 sprays into both nostrils daily., Disp: 16 g, Rfl: 0   hydrOXYzine (ATARAX) 25 MG tablet, Take 0.5-1 tablets (12.5-25 mg total) by mouth every 8 (eight) hours as needed for itching., Disp: 30 tablet, Rfl: 0   lidocaine (LIDODERM) 5 %, Place 1 patch onto the skin daily as needed. Apply patch to area most significant pain once per day.  Remove and discard patch within 12 hours of application., Disp: 15 patch, Rfl: 0   loratadine (CLARITIN) 10 MG tablet, Take 1 tablet (10 mg total) by mouth daily., Disp: 30 tablet, Rfl: 0   metroNIDAZOLE (FLAGYL) 500 MG tablet, Take 1 tablet (500 mg total) by mouth 2  (two) times daily., Disp: 14 tablet, Rfl: 0   montelukast (SINGULAIR) 10 MG tablet, Take 1 tablet (10 mg total) by mouth at bedtime., Disp: 30 tablet, Rfl: 0   naproxen (NAPROSYN) 500 MG tablet, Take 1 tablet (500 mg total) by mouth 2 (two) times daily as needed for moderate pain., Disp: 15 tablet, Rfl: 0   nitrofurantoin, macrocrystal-monohydrate, (MACROBID) 100 MG capsule, Take 1 capsule (100 mg total) by mouth 2 (two) times daily., Disp: 10 capsule, Rfl: 0   permethrin (ELIMITE) 5 % cream, Thoroughly massage cream (30 g for average adult) from head to soles of feet; leave on for 8 to 14 hours before removing (shower or bath)., Disp: 60 g, Rfl: 0   predniSONE (DELTASONE) 20 MG tablet, Take 2 tablets daily with breakfast., Disp: 10 tablet, Rfl: 0   promethazine-dextromethorphan (PROMETHAZINE-DM) 6.25-15 MG/5ML syrup, Take 5 mLs by mouth 3 (three) times daily as needed for cough., Disp: 200 mL, Rfl: 0   SUMAtriptan (IMITREX) 50 MG tablet, SMARTSIG:1 Tablet(s) By Mouth 1-2 Times Daily, Disp: , Rfl:    triamcinolone (KENALOG) 0.025 % ointment, Apply 1 Application topically 2 (two) times daily., Disp: 30 g, Rfl: 0   Medications ordered in this encounter:  Meds ordered this encounter  Medications   valACYclovir (VALTREX) 1000 MG tablet    Sig: Take 1 tablet (1,000 mg total) by mouth 2 (two) times daily for 10 days.    Dispense:  20 tablet  Refill:  0     *If you need refills on other medications prior to your next appointment, please contact your pharmacy*  Follow-Up: Call back or seek an in-person evaluation if the symptoms worsen or if the condition fails to improve as anticipated.  [facility] [phone]  Other Instructions Genital Herpes Genital herpes is a common sexually transmitted infection (STI) that is caused by a virus. The virus spreads from person to person through contact with a sore, infected saliva, or infected skin. The virus can cause itching, blisters,  and sores around the genitals or rectum. During an outbreak of infection, symptoms may last for several days and then go away. However, the virus remains in the body, so more outbreaks may happen in the future. The time between outbreaks varies and can be from months to years. Genital herpes can affect anyone. It is particularly concerning for pregnant women because the virus can be passed to the baby during delivery. Genital herpes is also a concern for people who have a weak disease-fighting system (immune system). What are the causes? This condition is caused by the herpes simplex virus, type 1 or type 2 (HSV-1 or HSV-2). The virus may spread through: Sexual contact with an infected person, including vaginal, anal, and oral sex. Contact with a herpes sore. The skin. This means that you can get herpes from an infected partner even if there are no blisters or sores present. Your partner may not know that he or she is infected. What increases the risk? You are more likely to develop this condition if: You have sex with many partners. You do not use latex or polyurethane condoms during sex. What are the signs or symptoms? Most people do not have symptoms or they have mild symptoms that may be mistaken for other skin problems. Symptoms may include: Small, red bumps near the genitals, rectum, or mouth. These bumps turn into blisters and then sores. Flu-like (influenza-like) symptoms, including: Fever. Body aches. Swollen lymph nodes. Headache. Painful urination. Pain and itching in the genital area or rectal area. Vaginal discharge. Tingling or shooting pain in the legs and buttocks. Generally, symptoms are more severe and last longer during the first (primary) outbreak. Influenza-like symptoms are also more common during the primary outbreak. How is this diagnosed? This condition may be diagnosed based on: A physical exam. Your medical history. Blood tests. A test of a fluid sample  (culture) from an open sore. How is this treated? There is no cure for this condition, but treatment with antiviral medicines can do the following: Speed up healing and relieve symptoms. Help to reduce the spread of the virus to sexual partners. Limit the chance of future outbreaks, or make future outbreaks shorter. Lessen symptoms of future outbreaks. Your health care provider may also recommend over-the-counter medicines to help with pain and itching. Follow these instructions at home: If you have an outbreak:  Keep the affected areas dry and clean. Avoid rubbing or touching blisters and sores. If you do touch blisters or sores: Wash your hands thoroughly with soap and water for at least 20 seconds. If soap and water are not available, use an alcohol-based hand sanitizer. Do not touch your eyes afterward. Sexual activity Do not have sexual contact during active outbreaks. Practice safe sex. Herpes can spread even if your partner does not have blisters or sores. Latex or polyurethane condoms and female condoms may help prevent the spread of the herpes virus. Managing pain and discomfort If  directed, put ice on the painful area. To do this: Put ice in a plastic bag. Place a towel between your skin and the bag. Leave the ice on for 20 minutes, 2-3 times a day. Remove the ice if your skin turns bright red. This is very important. If you cannot feel pain, heat, or cold, you have a greater risk of damage to the area. If told, take a cool sitz bath to help relieve pain or itching. A sitz bath is a water bath that you take while sitting down in water that is deep enough to cover your hips and buttocks. General instructions Take over-the-counter and prescription medicines only as told by your health care provider. If you were prescribed an antiviral medicine, use it as told by your health care provider. Do not stop using the antiviral even if you start to feel better. Keep all follow-up visits.  This is important. How is this prevented? Use condoms. Although you can get genital herpes during sexual contact even with the use of a condom, a condom can provide some protection. Avoid having multiple sexual partners. Talk with your sexual partner about any symptoms either of you may have. Also, talk with your partner about any history of STIs. Do not have sexual contact if you have active symptoms of genital herpes. Contact a health care provider if: Your symptoms are not improving with medicine. Your symptoms return, or you have new symptoms. You have a fever. You have abdominal pain. You have redness, swelling, or pain in your eye. You notice new sores on other parts of your body. You have had herpes and you become pregnant or plan to become pregnant. Get help right away if: You have symptoms of viral meningitis. This is rare but may happen if the virus spreads to the brain. Symptoms may include: Severe headache or stiff neck. Muscle aches. Nausea and vomiting. Sensitivity to light. Summary Genital herpes is a common sexually transmitted infection (STI) that is caused by the herpes simplex virus, type 1 or type 2 (HSV-1 or HSV-2). These viruses are most often spread through sexual contact with an infected person. You are more likely to develop this condition if you have sex with many partners or you do not use condoms during sex. Most people do not have symptoms or have mild symptoms that may be mistaken for other skin problems. Symptoms occur as outbreaks that may happen months or years apart. There is no cure for this condition, but treatment with oral antiviral medicines can reduce symptoms, reduce the chance of spreading the virus to a partner, prevent future outbreaks, or shorten future outbreaks. This information is not intended to replace advice given to you by your health care provider. Make sure you discuss any questions you have with your health care provider. Document  Revised: 04/25/2021 Document Reviewed: 04/25/2021 Elsevier Patient Education  2024 Elsevier Inc.   If you have been instructed to have an in-person evaluation today at a local Urgent Care facility, please use the link below. It will take you to a list of all of our available [facility], including address, phone number and hours of operation. Please do not delay care.  [facility]  If you or a family member do not have a primary care provider, use the link below to schedule a visit and establish care. When you choose a [facility] primary care physician or advanced practice provider, you gain a long-term partner in health. Find a Primary  Care Provider  Learn more about [facility] in-office and virtual care options:  - Get Care Now

## 2023-10-24 NOTE — Progress Notes (Signed)
 Virtual Visit Consent   Michele Vasquez, you are scheduled for a virtual visit with a [facility] provider today. Just as with appointments in the office, your consent must be obtained to participate. Your consent will be active for this visit and any virtual visit you may have with one of our providers in the next 365 days. If you have a MyChart account, a copy of this consent can be sent to you electronically.  As this is a virtual visit, video technology does not allow for your provider to perform a traditional examination. This may limit your provider's ability to fully assess your condition. If your provider identifies any concerns that need to be evaluated in person or the need to arrange testing (such as labs, EKG, etc.), we will make arrangements to do so. Although advances in technology are sophisticated, we cannot ensure that it will always work on either your end or our end. If the connection with a video visit is poor, the visit may have to be switched to a telephone visit. With either a video or telephone visit, we are not always able to ensure that we have a secure connection.  By engaging in this virtual visit, you consent to the provision of healthcare and authorize for your insurance to be billed (if applicable) for the services provided during this visit. Depending on your insurance coverage, you may receive a charge related to this service.  I need to obtain your verbal consent now. Are you willing to proceed with your visit today? Michele Vasquez has provided verbal consent on 10/24/2023 for a virtual visit (video or telephone). Reed Pandy, [place]  Date: 10/24/2023 2:53 PM   Virtual Visit via Video Note   I, Reed Pandy, connected with  Michele Vasquez  ([id number], [birth year]) on 10/24/23 at  2:45 PM EDT by a video-enabled telemedicine application and verified that I am speaking with the correct person using two identifiers.  Location: Patient:  Virtual Visit Location Patient: Parked car Provider: Virtual Visit Location Provider: Home Office   I discussed the limitations of evaluation and management by telemedicine and the availability of in person appointments. The patient expressed understanding and agreed to proceed.    History of Present Illness: Michele Vasquez is a 23 y.o. who identifies as a female who was assigned female at birth, and is being seen today for c/o a sore on her bottom.  Pt states she hurts very bad.  Pt states the sore disappears and then pops back up.  Pt states she has blisters on her lips and states the blisters look the same on her bottom. Pt states the blisters are at the crease of the buttocks.   HPI: HPI  Problems: There are no active problems to display for this patient.   Allergies:  Allergies  Allergen Reactions   Cat Dander Other (See Comments)    Asthma flares up   Medications:  Current Outpatient Medications:    valACYclovir (VALTREX) 1000 MG tablet, Take 1 tablet (1,000 mg total) by mouth 2 (two) times daily for 10 days., Disp: 20 tablet, Rfl: 0   albuterol (VENTOLIN HFA) 108 (90 Base) MCG/ACT inhaler, Inhale 1-2 puffs into the lungs every 6 (six) hours as needed for wheezing or shortness of breath., Disp: 18 g, Rfl: 0   AZO-CRANBERRY PO, Take by mouth., Disp: , Rfl:    cetirizine (ZYRTEC) 10 MG tablet, Take 10 mg by mouth daily., Disp: , Rfl:    fluconazole (DIFLUCAN) 150  MG tablet, Take 1 tablet (150 mg total) by mouth daily., Disp: 1 tablet, Rfl: 0   fluticasone (FLONASE) 50 MCG/ACT nasal spray, Place 2 sprays into both nostrils daily., Disp: 16 g, Rfl: 0   hydrOXYzine (ATARAX) 25 MG tablet, Take 0.5-1 tablets (12.5-25 mg total) by mouth every 8 (eight) hours as needed for itching., Disp: 30 tablet, Rfl: 0   lidocaine (LIDODERM) 5 %, Place 1 patch onto the skin daily as needed. Apply patch to area most significant pain once per day.  Remove and discard patch within 12 hours of  application., Disp: 15 patch, Rfl: 0   loratadine (CLARITIN) 10 MG tablet, Take 1 tablet (10 mg total) by mouth daily., Disp: 30 tablet, Rfl: 0   metroNIDAZOLE (FLAGYL) 500 MG tablet, Take 1 tablet (500 mg total) by mouth 2 (two) times daily., Disp: 14 tablet, Rfl: 0   montelukast (SINGULAIR) 10 MG tablet, Take 1 tablet (10 mg total) by mouth at bedtime., Disp: 30 tablet, Rfl: 0   naproxen (NAPROSYN) 500 MG tablet, Take 1 tablet (500 mg total) by mouth 2 (two) times daily as needed for moderate pain., Disp: 15 tablet, Rfl: 0   nitrofurantoin, macrocrystal-monohydrate, (MACROBID) 100 MG capsule, Take 1 capsule (100 mg total) by mouth 2 (two) times daily., Disp: 10 capsule, Rfl: 0   permethrin (ELIMITE) 5 % cream, Thoroughly massage cream (30 g for average adult) from head to soles of feet; leave on for 8 to 14 hours before removing (shower or bath)., Disp: 60 g, Rfl: 0   predniSONE (DELTASONE) 20 MG tablet, Take 2 tablets daily with breakfast., Disp: 10 tablet, Rfl: 0   promethazine-dextromethorphan (PROMETHAZINE-DM) 6.25-15 MG/5ML syrup, Take 5 mLs by mouth 3 (three) times daily as needed for cough., Disp: 200 mL, Rfl: 0   SUMAtriptan (IMITREX) 50 MG tablet, SMARTSIG:1 Tablet(s) By Mouth 1-2 Times Daily, Disp: , Rfl:    triamcinolone (KENALOG) 0.025 % ointment, Apply 1 Application topically 2 (two) times daily., Disp: 30 g, Rfl: 0  Observations/Objective: Patient is well-developed, well-nourished in no acute distress.  Resting comfortably. Head is normocephalic, atraumatic.  No labored breathing.  Speech is clear and coherent with logical content.  Patient is alert and oriented at baseline.    Assessment and Plan: 1. Herpes infection (Primary)  -Start Valrex -Advised Pt to follow up with PCP for further evaluation -Advised abstaining from sexual activity until completion of medication  Follow Up Instructions: I discussed the assessment and treatment plan with the patient. The patient was  provided an opportunity to ask questions and all were answered. The patient agreed with the plan and demonstrated an understanding of the instructions.  A copy of instructions were sent to the patient via MyChart unless otherwise noted below.    The patient was advised to call back or seek an in-person evaluation if the symptoms worsen or if the condition fails to improve as anticipated.    Reed Pandy, PA-C

## 2023-10-25 ENCOUNTER — Ambulatory Visit

## 2023-12-28 ENCOUNTER — Other Ambulatory Visit: Payer: Self-pay

## 2023-12-28 ENCOUNTER — Ambulatory Visit
Admission: RE | Admit: 2023-12-28 | Discharge: 2023-12-28 | Disposition: A | Discharge status: Home / Self Care | Source: Ambulatory Visit | Attending: Family Medicine | Admitting: Family Medicine

## 2023-12-28 VITALS — BP 102/68 | HR 97 | Temp 98.7°F | Resp 16

## 2023-12-28 DIAGNOSIS — N3001 Acute cystitis with hematuria: Secondary | ICD-10-CM | POA: Diagnosis present

## 2023-12-28 DIAGNOSIS — J4521 Mild intermittent asthma with (acute) exacerbation: Secondary | ICD-10-CM | POA: Diagnosis present

## 2023-12-28 DIAGNOSIS — Z113 Encounter for screening for infections with a predominantly sexual mode of transmission: Secondary | ICD-10-CM | POA: Diagnosis present

## 2023-12-28 DIAGNOSIS — J209 Acute bronchitis, unspecified: Secondary | ICD-10-CM | POA: Diagnosis present

## 2023-12-28 DIAGNOSIS — N898 Other specified noninflammatory disorders of vagina: Secondary | ICD-10-CM | POA: Diagnosis present

## 2023-12-28 LAB — POCT URINALYSIS DIP (MANUAL ENTRY)
Bilirubin, UA: NEGATIVE
Glucose, UA: NEGATIVE mg/dL
Ketones, POC UA: NEGATIVE mg/dL
Nitrite, UA: NEGATIVE
Protein Ur, POC: NEGATIVE mg/dL
Spec Grav, UA: 1.025 (ref 1.010–1.025)
Urobilinogen, UA: 1 U/dL
pH, UA: 7 (ref 5.0–8.0)

## 2023-12-28 LAB — POCT URINE PREGNANCY: Preg Test, Ur: NEGATIVE

## 2023-12-28 MED ORDER — AMOXICILLIN-POT CLAVULANATE 875-125 MG PO TABS: 1.0000 | ORAL_TABLET | Freq: Two times a day (BID) | ORAL | 0 refills | Status: DC

## 2023-12-28 MED ORDER — PREDNISONE 20 MG PO TABS: 40.0000 mg | ORAL_TABLET | Freq: Every day | ORAL | 0 refills | Status: AC

## 2023-12-28 MED ORDER — FLUCONAZOLE 150 MG PO TABS: 150.0000 mg | ORAL_TABLET | Freq: Every day | ORAL | 0 refills | Status: AC

## 2023-12-28 MED ORDER — ALBUTEROL SULFATE HFA 108 (90 BASE) MCG/ACT IN AERS: 1.0000 | INHALATION_SPRAY | Freq: Four times a day (QID) | RESPIRATORY_TRACT | 0 refills | Status: DC | PRN

## 2023-12-28 MED ORDER — PROMETHAZINE-DM 6.25-15 MG/5ML PO SYRP: 5.0000 mL | ORAL_SOLUTION | Freq: Four times a day (QID) | ORAL | 0 refills | Status: DC | PRN

## 2023-12-28 NOTE — Discharge Instructions (Addendum)
 The clinical contact you with results of the urine culture as well as vaginal swab/STD testing done today if positive.  Start Diflucan  to treat presumed yeast infection.  Start Augmentin twice daily for 7 days to treat both her UTI and her bronchitis.  Start prednisone  daily for 5 days to help with your asthma symptoms.  You may take Promethazine  DM as needed for your cough, please note this medication will make you drowsy.  Do not drink alcohol or drive on this medication.  Continue your inhalers as prescribed.  Lots of rest and fluids.  Please follow-up with your PCP if your symptoms do not improve.  Please go to the ER for any worsening symptoms.  Hope you feel better soon!

## 2023-12-28 NOTE — ED Triage Notes (Addendum)
 Pt c/o vaginal itching, dysuria, yellow vaginal dischargex1-2d. Pt c/o SOB and productive cough w/yellow mucousx1wk. PT states its worse at night and can't sleep

## 2023-12-28 NOTE — ED Provider Notes (Signed)
 [facility]    CSN: [id number] Arrival date & time: 12/28/23  1642      History   Chief Complaint Chief Complaint  Patient presents with   Exposure to STD    Not exposed to std . Just want a check up for everything . - Entered by patient    HPI Michele Vasquez is a 23 y.o. female  presents for evaluation of URI symptoms for 7 days. Patient reports associated symptoms of cough, congestion, wheezing and shortness of breath. Denies N/V/D, fevers, ear pain, body aches. Patient does  have a hx of asthma.  Has been using her home nebulizer as well as albuterol  inhaler.  She also takes Advair daily.  Patient is an active smoker.   Reports no sick contacts.  In addition patient reports 1 to 2 days of dysuria with vaginal itching and yellow discharge.  Denies any known STD exposure but would like screening.  Pt has taken nothing OTC for symptoms. Pt has no other concerns at this time.    Exposure to STD Associated symptoms include shortness of breath.    Past Medical History:  Diagnosis Date   Asthma    Eczema    Seasonal allergies     There are no active problems to display for this patient.   Past Surgical History:  Procedure Laterality Date   CHOLECYSTECTOMY     TONSILLECTOMY      OB History   No obstetric history on file.      Home Medications    Prior to Admission medications   Medication Sig Start Date End Date Taking? Authorizing Provider  albuterol  (VENTOLIN  HFA) 108 (90 Base) MCG/ACT inhaler Inhale 1-2 puffs into the lungs every 6 (six) hours as needed. 12/28/23  Yes Ayan Yankey, Jodi R, NP  amoxicillin-clavulanate (AUGMENTIN) 875-125 MG tablet Take 1 tablet by mouth every 12 (twelve) hours. 12/28/23  Yes Kellyanne Ellwanger, Jodi R, NP  fluconazole  (DIFLUCAN ) 150 MG tablet Take 1 tablet (150 mg total) by mouth daily for 2 days. Take 1 tablet today and may repeat in 3 days if symptoms persist 12/28/23 12/30/23 Yes Jonty Morrical, Jodi R, NP  predniSONE  (DELTASONE ) 20 MG  tablet Take 2 tablets (40 mg total) by mouth daily with breakfast for 5 days. 12/28/23 01/02/24 Yes Shyheim Tanney, Jodi R, NP  promethazine -dextromethorphan (PROMETHAZINE -DM) 6.25-15 MG/5ML syrup Take 5 mLs by mouth 4 (four) times daily as needed for cough. 12/28/23  Yes Sherrise Liberto, Jodi R, NP  AZO-CRANBERRY PO Take by mouth.    [provider]  cetirizine (ZYRTEC) 10 MG tablet Take 10 mg by mouth daily.    [provider]  fluticasone  (FLONASE ) 50 MCG/ACT nasal spray Place 2 sprays into both nostrils daily. 10/17/20   Sharla Davis, PA-C  hydrOXYzine  (ATARAX ) 25 MG tablet Take 0.5-1 tablets (12.5-25 mg total) by mouth every 8 (eight) hours as needed for itching. 08/04/23   Adolph Hoop, PA-C  lidocaine  (LIDODERM ) 5 % Place 1 patch onto the skin daily as needed. Apply patch to area most significant pain once per day.  Remove and discard patch within 12 hours of application. 02/09/22   Petrucelli, Samantha R, PA-C  loratadine  (CLARITIN ) 10 MG tablet Take 1 tablet (10 mg total) by mouth daily. 10/17/20   Sharla Davis, PA-C  montelukast  (SINGULAIR ) 10 MG tablet Take 1 tablet (10 mg total) by mouth at bedtime. 10/17/20   Sharla Davis, PA-C  naproxen  (NAPROSYN ) 500 MG tablet Take 1 tablet (500 mg total)  by mouth 2 (two) times daily as needed for moderate pain. 02/09/22   Petrucelli, Samantha R, PA-C  nitrofurantoin , macrocrystal-monohydrate, (MACROBID ) 100 MG capsule Take 1 capsule (100 mg total) by mouth 2 (two) times daily. 12/02/21   Dodson Freestone, FNP  permethrin  (ELIMITE ) 5 % cream Thoroughly massage cream (30 g for average adult) from head to soles of feet; leave on for 8 to 14 hours before removing (shower or bath). 08/06/23   Lanetta Pion, NP  SUMAtriptan (IMITREX) 50 MG tablet SMARTSIG:1 Tablet(s) By Mouth 1-2 Times Daily 02/08/22   [provider]  triamcinolone  (KENALOG ) 0.025 % ointment Apply 1 Application topically 2 (two) times daily. 08/06/23   Lanetta Pion, NP  dicyclomine   (BENTYL ) 20 MG tablet Take 1 tablet (20 mg total) by mouth 4 (four) times daily -  before meals and at bedtime. 04/30/20 06/05/20  Wieters, Hallie C, PA-C  esomeprazole  (NEXIUM ) 40 MG capsule Take 1 capsule (40 mg total) by mouth daily. 05/08/15 04/30/20  Vianne Grad, MD  famotidine  (PEPCID ) 20 MG tablet Take 1 tablet (20 mg total) by mouth 2 (two) times daily. 04/30/20 06/05/20  Wieters, Hallie C, PA-C  omeprazole  (PRILOSEC) 20 MG capsule Take 1 capsule (20 mg total) by mouth 2 (two) times daily before a meal for 15 days. 04/30/20 06/05/20  Wieters, Hallie C, PA-C    Family History Family History  Problem Relation Age of Onset   Hypertension Mother    Migraines Mother    Asthma Father    Heart disease Father    Hypertension Father    Breast cancer Maternal Grandmother    Colon cancer Maternal Grandmother    Stomach cancer Maternal Grandmother    Diabetes Maternal Grandmother     Social History Social History   Tobacco Use   Smoking status: Every Day    Current packs/day: 0.50    Types: Cigarettes   Smokeless tobacco: Never  Vaping Use   Vaping status: Never Used  Substance Use Topics   Alcohol use: Yes    Comment: soc   Drug use: Not Currently    Types: Marijuana     Allergies   Cat dander   Review of Systems Review of Systems  HENT:  Positive for congestion.   Respiratory:  Positive for cough, shortness of breath and wheezing.   Genitourinary:  Positive for dysuria and vaginal discharge.     Physical Exam Triage Vital Signs ED Triage Vitals  Encounter Vitals Group     BP 12/28/23 1658 102/68     Systolic BP Percentile --      Diastolic BP Percentile --      Pulse Rate 12/28/23 1658 97     Resp 12/28/23 1658 16     Temp 12/28/23 1658 98.7 F (37.1 C)     Temp Source 12/28/23 1658 Oral     SpO2 12/28/23 1658 91 %     Weight --      Height --      Head Circumference --      Peak Flow --      Pain Score 12/28/23 1654 5     Pain Loc --      Pain Education  --      Exclude from Growth Chart --    No data found.  Updated Vital Signs BP 102/68   Pulse 97   Temp 98.7 F (37.1 C) (Oral)   Resp 16   LMP 12/22/2023   SpO2  98%   Visual Acuity Right Eye Distance:   Left Eye Distance:   Bilateral Distance:    Right Eye Near:   Left Eye Near:    Bilateral Near:     Physical Exam Vitals and nursing note reviewed.  Constitutional:      General: She is not in acute distress.    Appearance: She is well-developed. She is not ill-appearing.  HENT:     Head: Normocephalic and atraumatic.     Right Ear: Tympanic membrane and ear canal normal.     Left Ear: Tympanic membrane and ear canal normal.     Nose: Congestion present.     Mouth/Throat:     Mouth: Mucous membranes are moist.     Pharynx: Oropharynx is clear. Uvula midline. No posterior oropharyngeal erythema.     Tonsils: No tonsillar exudate or tonsillar abscesses.  Eyes:     Conjunctiva/sclera: Conjunctivae normal.     Pupils: Pupils are equal, round, and reactive to light.  Cardiovascular:     Rate and Rhythm: Normal rate and regular rhythm.     Heart sounds: Normal heart sounds.  Pulmonary:     Effort: Pulmonary effort is normal.     Breath sounds: Normal breath sounds. No wheezing, rhonchi or rales.  Abdominal:     Tenderness: There is no right CVA tenderness or left CVA tenderness.  Musculoskeletal:     Cervical back: Normal range of motion and neck supple.  Lymphadenopathy:     Cervical: No cervical adenopathy.  Skin:    General: Skin is warm and dry.  Neurological:     General: No focal deficit present.     Mental Status: She is alert and oriented to person, place, and time.  Psychiatric:        Mood and Affect: Mood normal.        Behavior: Behavior normal.      UC Treatments / Results  Labs (all labs ordered are listed, but only abnormal results are displayed) Labs Reviewed  POCT URINALYSIS DIP (MANUAL ENTRY) - Abnormal; Notable for the following  components:      Result Value   Clarity, UA cloudy (*)    Blood, UA trace-intact (*)    Leukocytes, UA Large (3+) (*)    All other components within normal limits  URINE CULTURE  RPR  HIV ANTIBODY (ROUTINE TESTING W REFLEX)  POCT URINE PREGNANCY  CERVICOVAGINAL ANCILLARY ONLY    EKG   Radiology No results found.  Procedures Procedures (including critical care time)  Medications Ordered in UC Medications - No data to display  Initial Impression / Assessment and Plan / UC Course  I have reviewed the triage vital signs and the nursing notes.  Pertinent labs & imaging results that were available during my care of the patient were reviewed by me and considered in my medical decision making (see chart for details).  Clinical Course as of 12/28/23 1728  Mon Dec 28, 2023  1727 O2 recheck on her right great toe 98% on room air.  Initial read low due to her nails. [JM]    Clinical Course User Index [JM] Alleen Arbour, NP    Reviewed exam and symptoms with patient.  No red flags.  UA positive for UTI, will send urine culture.  STD testing/vaginal swab was ordered we will contact for any positive results.  Will start Diflucan  for presumed yeast infection.  Will start Augmentin twice daily that will treat both UTI and bronchitis.  Prednisone  daily for 5 days.  Refilled albuterol  inhaler to use as needed.  Promethazine  DM as needed for cough, side effect profile reviewed.  Discussed rest fluids and PCP follow-up if symptoms do not improve.  ER precautions reviewed and patient verbalized understanding. Final Clinical Impressions(s) / UC Diagnoses   Final diagnoses:  Acute cystitis with hematuria  Vaginal itching  Screening examination for STD (sexually transmitted disease)  Acute bronchitis, unspecified organism  Mild intermittent asthma with acute exacerbation     Discharge Instructions      The clinical contact you with results of the urine culture as well as vaginal swab/STD  testing done today if positive.  Start Diflucan  to treat presumed yeast infection.  Start Augmentin twice daily for 7 days to treat both her UTI and her bronchitis.  Start prednisone  daily for 5 days to help with your asthma symptoms.  You may take Promethazine  DM as needed for your cough, please note this medication will make you drowsy.  Do not drink alcohol or drive on this medication.  Continue your inhalers as prescribed.  Lots of rest and fluids.  Please follow-up with your PCP if your symptoms do not improve.  Please go to the ER for any worsening symptoms.  Hope you feel better soon!  ED Prescriptions     Medication Sig Dispense Auth. Provider   amoxicillin-clavulanate (AUGMENTIN) 875-125 MG tablet Take 1 tablet by mouth every 12 (twelve) hours. 14 tablet Aislin Onofre, Jodi R, NP   albuterol  (VENTOLIN  HFA) 108 (90 Base) MCG/ACT inhaler Inhale 1-2 puffs into the lungs every 6 (six) hours as needed. 1 each Sabel Hornbeck, Jodi R, NP   predniSONE  (DELTASONE ) 20 MG tablet Take 2 tablets (40 mg total) by mouth daily with breakfast for 5 days. 10 tablet Lyndell Gillyard, Jodi R, NP   promethazine -dextromethorphan (PROMETHAZINE -DM) 6.25-15 MG/5ML syrup Take 5 mLs by mouth 4 (four) times daily as needed for cough. 118 mL Lynnita Somma, Jodi R, NP   fluconazole  (DIFLUCAN ) 150 MG tablet Take 1 tablet (150 mg total) by mouth daily for 2 days. Take 1 tablet today and may repeat in 3 days if symptoms persist 2 tablet Sulayman Manning, Jodi R, NP      PDMP not reviewed this encounter.   Alleen Arbour, NP 12/28/23 1728

## 2023-12-29 LAB — CERVICOVAGINAL ANCILLARY ONLY
Bacterial Vaginitis (gardnerella): POSITIVE — AB
Candida Glabrata: NEGATIVE
Candida Vaginitis: NEGATIVE
Chlamydia: NEGATIVE
Comment: NEGATIVE
Comment: NEGATIVE
Comment: NEGATIVE
Comment: NEGATIVE
Comment: NEGATIVE
Comment: NORMAL
Neisseria Gonorrhea: NEGATIVE
Trichomonas: POSITIVE — AB

## 2023-12-30 LAB — URINE CULTURE: Culture: 30000 — AB

## 2023-12-31 ENCOUNTER — Ambulatory Visit (HOSPITAL_COMMUNITY): Payer: Self-pay

## 2023-12-31 LAB — HIV ANTIBODY (ROUTINE TESTING W REFLEX): HIV Screen 4th Generation wRfx: NONREACTIVE

## 2023-12-31 LAB — RPR, QUANT+TP ABS (REFLEX)
Rapid Plasma Reagin, Quant: 1:4 {titer} — ABNORMAL HIGH
T Pallidum Abs: REACTIVE — AB

## 2023-12-31 LAB — RPR: RPR Ser Ql: REACTIVE — AB

## 2024-01-01 ENCOUNTER — Telehealth: Admitting: Family Medicine

## 2024-01-01 NOTE — Progress Notes (Signed)
 Pt did not show for visit [facility]

## 2024-01-03 ENCOUNTER — Telehealth: Admitting: Family

## 2024-01-03 DIAGNOSIS — A599 Trichomoniasis, unspecified: Secondary | ICD-10-CM | POA: Diagnosis not present

## 2024-01-03 MED ORDER — METRONIDAZOLE 500 MG PO TABS: 500.0000 mg | ORAL_TABLET | Freq: Three times a day (TID) | ORAL | 0 refills | Status: AC

## 2024-01-03 NOTE — Progress Notes (Signed)
 Virtual Visit Consent   Michele Vasquez, you are scheduled for a virtual visit with a  provider today. Just as with appointments in the office, your consent must be obtained to participate. Your consent will be active for this visit and any virtual visit you may have with one of our providers in the next 365 days. If you have a MyChart account, a copy of this consent can be sent to you electronically.  As this is a virtual visit, video technology does not allow for your provider to perform a traditional examination. This may limit your provider's ability to fully assess your condition. If your provider identifies any concerns that need to be evaluated in person or the need to arrange testing (such as labs, EKG, etc.), we will make arrangements to do so. Although advances in technology are sophisticated, we cannot ensure that it will always work on either your end or our end. If the connection with a video visit is poor, the visit may have to be switched to a telephone visit. With either a video or telephone visit, we are not always able to ensure that we have a secure connection.  By engaging in this virtual visit, you consent to the provision of healthcare and authorize for your insurance to be billed (if applicable) for the services provided during this visit. Depending on your insurance coverage, you may receive a charge related to this service.  I need to obtain your verbal consent now. Are you willing to proceed with your visit today? Michele Vasquez has provided verbal consent on 01/03/2024 for a virtual visit (video or telephone). Tommas Fragmin, FNP  Date: 01/03/2024 5:20 PM   Virtual Visit via Video Note   I, Tommas Fragmin, connected with  Michele Vasquez  ([id number], 04/14/2001) on 01/03/24 at  5:00 PM EDT by a video-enabled telemedicine application and verified that I am speaking with the correct person using two identifiers.  Location: Patient:  Virtual Visit Location Patient: car Provider: Virtual Visit Location Provider: Home Office   I discussed the limitations of evaluation and management by telemedicine and the availability of in person appointments. The patient expressed understanding and agreed to proceed.    History of Present Illness: Michele Vasquez is a 23 y.o. who identifies as a female who was assigned female at birth, and is being seen today for trich. She went to ED and start Augmentin  for a UTI, but her trich was positive and has not been treated.   HPI: HPI  Problems: There are no active problems to display for this patient.   Allergies:  Allergies  Allergen Reactions   Cat Dander Other (See Comments)    Asthma flares up   Medications:  Current Outpatient Medications:    metroNIDAZOLE  (FLAGYL ) 500 MG tablet, Take 1 tablet (500 mg total) by mouth 3 (three) times daily for 7 days., Disp: 21 tablet, Rfl: 0   albuterol  (VENTOLIN  HFA) 108 (90 Base) MCG/ACT inhaler, Inhale 1-2 puffs into the lungs every 6 (six) hours as needed., Disp: 1 each, Rfl: 0   amoxicillin -clavulanate (AUGMENTIN ) 875-125 MG tablet, Take 1 tablet by mouth every 12 (twelve) hours., Disp: 14 tablet, Rfl: 0   AZO-CRANBERRY PO, Take by mouth., Disp: , Rfl:    cetirizine (ZYRTEC) 10 MG tablet, Take 10 mg by mouth daily., Disp: , Rfl:    fluticasone  (FLONASE ) 50 MCG/ACT nasal spray, Place 2 sprays into both nostrils daily., Disp: 16 g, Rfl: 0   hydrOXYzine  (ATARAX )  25 MG tablet, Take 0.5-1 tablets (12.5-25 mg total) by mouth every 8 (eight) hours as needed for itching., Disp: 30 tablet, Rfl: 0   lidocaine  (LIDODERM ) 5 %, Place 1 patch onto the skin daily as needed. Apply patch to area most significant pain once per day.  Remove and discard patch within 12 hours of application., Disp: 15 patch, Rfl: 0   loratadine  (CLARITIN ) 10 MG tablet, Take 1 tablet (10 mg total) by mouth daily., Disp: 30 tablet, Rfl: 0   montelukast  (SINGULAIR ) 10 MG  tablet, Take 1 tablet (10 mg total) by mouth at bedtime., Disp: 30 tablet, Rfl: 0   naproxen  (NAPROSYN ) 500 MG tablet, Take 1 tablet (500 mg total) by mouth 2 (two) times daily as needed for moderate pain., Disp: 15 tablet, Rfl: 0   nitrofurantoin , macrocrystal-monohydrate, (MACROBID ) 100 MG capsule, Take 1 capsule (100 mg total) by mouth 2 (two) times daily., Disp: 10 capsule, Rfl: 0   permethrin  (ELIMITE ) 5 % cream, Thoroughly massage cream (30 g for average adult) from head to soles of feet; leave on for 8 to 14 hours before removing (shower or bath)., Disp: 60 g, Rfl: 0   promethazine -dextromethorphan (PROMETHAZINE -DM) 6.25-15 MG/5ML syrup, Take 5 mLs by mouth 4 (four) times daily as needed for cough., Disp: 118 mL, Rfl: 0   SUMAtriptan (IMITREX) 50 MG tablet, SMARTSIG:1 Tablet(s) By Mouth 1-2 Times Daily, Disp: , Rfl:    triamcinolone  (KENALOG ) 0.025 % ointment, Apply 1 Application topically 2 (two) times daily., Disp: 30 g, Rfl: 0  Observations/Objective: Patient is well-developed, well-nourished in no acute distress.  Resting comfortably  at home.  Head is normocephalic, atraumatic.  No labored breathing.  Speech is clear and coherent with logical content.  Patient is alert and oriented at baseline.   Assessment and Plan: 1. Trichomonas infection (Primary) - metroNIDAZOLE  (FLAGYL ) 500 MG tablet; Take 1 tablet (500 mg total) by mouth 3 (three) times daily for 7 days.  Dispense: 21 tablet; Refill: 0  Start flagyl   Continue Augmentin   Safe sex  Follow up if symptoms worsen or do not improve   Follow Up Instructions: I discussed the assessment and treatment plan with the patient. The patient was provided an opportunity to ask questions and all were answered. The patient agreed with the plan and demonstrated an understanding of the instructions.  A copy of instructions were sent to the patient via MyChart unless otherwise noted below.    The patient was advised to call back or seek an  in-person evaluation if the symptoms worsen or if the condition fails to improve as anticipated.    Tommas Fragmin, FNP

## 2024-01-09 ENCOUNTER — Telehealth

## 2024-03-09 ENCOUNTER — Ambulatory Visit

## 2024-03-15 ENCOUNTER — Ambulatory Visit

## 2024-03-25 ENCOUNTER — Ambulatory Visit

## 2024-04-24 ENCOUNTER — Ambulatory Visit
Admission: EM | Admit: 2024-04-24 | Discharge: 2024-04-24 | Disposition: A | Discharge status: Home / Self Care | Attending: Family Medicine | Admitting: Family Medicine

## 2024-04-24 DIAGNOSIS — Z3201 Encounter for pregnancy test, result positive: Secondary | ICD-10-CM

## 2024-04-24 DIAGNOSIS — Z3200 Encounter for pregnancy test, result unknown: Secondary | ICD-10-CM

## 2024-04-24 LAB — POCT URINE PREGNANCY: Preg Test, Ur: POSITIVE — AB

## 2024-04-24 NOTE — ED Triage Notes (Signed)
 Pt present for a pregnancy test. States she took a test at home yesterday and today, both positive. Pt states she wants reassurance.

## 2024-04-24 NOTE — Discharge Instructions (Signed)
 Please establish with an obstetrician to discuss prenatal care.  Congratulations.

## 2024-04-24 NOTE — ED Provider Notes (Signed)
 [facility]    CSN: [id number] Arrival date & time: 04/24/24  1359      History   Chief Complaint Chief Complaint  Patient presents with   Possible Pregnancy    HPI Michele Vasquez is a 23 y.o. female presents for possible pregnancy.  Patient reports her last menstrual cycle was August 15.  She is not on birth control.  She reports she had 2 home pregnancy test that were positive and she wants to confirm.  No other concerns at this time   Possible Pregnancy    Past Medical History:  Diagnosis Date   Asthma    Eczema    Seasonal allergies     There are no active problems to display for this patient.   Past Surgical History:  Procedure Laterality Date   CHOLECYSTECTOMY     TONSILLECTOMY      OB History   No obstetric history on file.      Home Medications    Prior to Admission medications   Medication Sig Start Date End Date Taking? Authorizing Provider  albuterol  (VENTOLIN  HFA) 108 (90 Base) MCG/ACT inhaler Inhale 1-2 puffs into the lungs every 6 (six) hours as needed. 12/28/23   Kolt Mcwhirter, Jodi R, NP  amoxicillin -clavulanate (AUGMENTIN ) 875-125 MG tablet Take 1 tablet by mouth every 12 (twelve) hours. 12/28/23   Tangi Shroff, Jodi R, NP  AZO-CRANBERRY PO Take by mouth.    [provider]  cetirizine (ZYRTEC) 10 MG tablet Take 10 mg by mouth daily.    [provider]  fluticasone  (FLONASE ) 50 MCG/ACT nasal spray Place 2 sprays into both nostrils daily. 10/17/20   Tonette Lauraine HERO, PA-C  hydrOXYzine  (ATARAX ) 25 MG tablet Take 0.5-1 tablets (12.5-25 mg total) by mouth every 8 (eight) hours as needed for itching. 08/04/23   Christopher Savannah, PA-C  lidocaine  (LIDODERM ) 5 % Place 1 patch onto the skin daily as needed. Apply patch to area most significant pain once per day.  Remove and discard patch within 12 hours of application. 02/09/22   Petrucelli, Samantha R, PA-C  loratadine  (CLARITIN ) 10 MG tablet Take 1 tablet (10 mg total) by mouth daily.  10/17/20   Tonette Lauraine HERO, PA-C  montelukast  (SINGULAIR ) 10 MG tablet Take 1 tablet (10 mg total) by mouth at bedtime. 10/17/20   Tonette Lauraine HERO, PA-C  naproxen  (NAPROSYN ) 500 MG tablet Take 1 tablet (500 mg total) by mouth 2 (two) times daily as needed for moderate pain. 02/09/22   Petrucelli, Samantha R, PA-C  nitrofurantoin , macrocrystal-monohydrate, (MACROBID ) 100 MG capsule Take 1 capsule (100 mg total) by mouth 2 (two) times daily. 12/02/21   Hazen Darryle BRAVO, FNP  permethrin  (ELIMITE ) 5 % cream Thoroughly massage cream (30 g for average adult) from head to soles of feet; leave on for 8 to 14 hours before removing (shower or bath). 08/06/23   Moishe Chiquita HERO, NP  promethazine -dextromethorphan (PROMETHAZINE -DM) 6.25-15 MG/5ML syrup Take 5 mLs by mouth 4 (four) times daily as needed for cough. 12/28/23   Jaena Brocato, Jodi R, NP  SUMAtriptan (IMITREX) 50 MG tablet SMARTSIG:1 Tablet(s) By Mouth 1-2 Times Daily 02/08/22   [provider]  triamcinolone  (KENALOG ) 0.025 % ointment Apply 1 Application topically 2 (two) times daily. 08/06/23   Moishe Chiquita HERO, NP  dicyclomine  (BENTYL ) 20 MG tablet Take 1 tablet (20 mg total) by mouth 4 (four) times daily -  before meals and at bedtime. 04/30/20 06/05/20  Wieters, Hallie C, PA-C  esomeprazole  (NEXIUM ) 40  MG capsule Take 1 capsule (40 mg total) by mouth daily. 05/08/15 04/30/20  Alain Deward FALCON, MD  famotidine  (PEPCID ) 20 MG tablet Take 1 tablet (20 mg total) by mouth 2 (two) times daily. 04/30/20 06/05/20  Wieters, Hallie C, PA-C  omeprazole  (PRILOSEC) 20 MG capsule Take 1 capsule (20 mg total) by mouth 2 (two) times daily before a meal for 15 days. 04/30/20 06/05/20  Wieters, Hallie C, PA-C    Family History Family History  Problem Relation Age of Onset   Hypertension Mother    Migraines Mother    Asthma Father    Heart disease Father    Hypertension Father    Breast cancer Maternal Grandmother    Colon cancer Maternal Grandmother    Stomach cancer Maternal  Grandmother    Diabetes Maternal Grandmother     Social History Social History   Tobacco Use   Smoking status: Every Day    Current packs/day: 0.50    Types: Cigarettes   Smokeless tobacco: Never  Vaping Use   Vaping status: Never Used  Substance Use Topics   Alcohol use: Yes    Comment: soc   Drug use: Not Currently    Types: Marijuana     Allergies   Cat dander   Review of Systems Review of Systems  Genitourinary:  Positive for menstrual problem.     Physical Exam Triage Vital Signs ED Triage Vitals  Encounter Vitals Group     BP 04/24/24 1500 105/75     Girls Systolic BP Percentile --      Girls Diastolic BP Percentile --      Boys Systolic BP Percentile --      Boys Diastolic BP Percentile --      Pulse Rate 04/24/24 1459 89     Resp 04/24/24 1459 17     Temp 04/24/24 1459 98.3 F (36.8 C)     Temp Source 04/24/24 1459 Oral     SpO2 04/24/24 1459 99 %     Weight --      Height --      Head Circumference --      Peak Flow --      Pain Score 04/24/24 1456 0     Pain Loc --      Pain Education --      Exclude from Growth Chart --    No data found.  Updated Vital Signs BP 105/75   Pulse 89   Temp 98.3 F (36.8 C) (Oral)   Resp 17   LMP 04/18/2024 (Exact Date)   SpO2 99%   Visual Acuity Right Eye Distance:   Left Eye Distance:   Bilateral Distance:    Right Eye Near:   Left Eye Near:    Bilateral Near:     Physical Exam Vitals and nursing note reviewed.  Constitutional:      Appearance: Normal appearance.  HENT:     Head: Normocephalic and atraumatic.  Eyes:     Pupils: Pupils are equal, round, and reactive to light.  Cardiovascular:     Rate and Rhythm: Normal rate.  Pulmonary:     Effort: Pulmonary effort is normal.  Skin:    General: Skin is warm and dry.  Neurological:     General: No focal deficit present.     Mental Status: She is alert and oriented to person, place, and time.  Psychiatric:        Mood and Affect: Mood  normal.  Behavior: Behavior normal.      UC Treatments / Results  Labs (all labs ordered are listed, but only abnormal results are displayed) Labs Reviewed  POCT URINE PREGNANCY - Abnormal; Notable for the following components:      Result Value   Preg Test, Ur Positive (*)    All other components within normal limits    EKG   Radiology No results found.  Procedures Procedures (including critical care time)  Medications Ordered in UC Medications - No data to display  Initial Impression / Assessment and Plan / UC Course  I have reviewed the triage vital signs and the nursing notes.  Pertinent labs & imaging results that were available during my care of the patient were reviewed by me and considered in my medical decision making (see chart for details).     Positive urine pregnancy in clinic.  Reviewed with patient.  Advised her to establish with OB for prenatal care.  Follow-up with PCP as needed. Final Clinical Impressions(s) / UC Diagnoses   Final diagnoses:  Possible pregnancy  Positive urine pregnancy test     Discharge Instructions      Please establish with an obstetrician to discuss prenatal care.  Congratulations.    ED Prescriptions   None    PDMP not reviewed this encounter.   Loreda Myla SAUNDERS, NP 04/24/24 [phone]

## 2024-05-06 ENCOUNTER — Telehealth: Payer: Self-pay | Admitting: Obstetrics & Gynecology

## 2024-05-06 NOTE — Telephone Encounter (Signed)
 Reached out to pt to reschedule [facility]B Nurse Intake that was scheduled on Tuesday, Oct. 7 at 11:15.  Need to reschedule to Oct. 10th or Oct. 13th.  Left message for pt to call back to reschedule.

## 2024-05-10 ENCOUNTER — Telehealth

## 2024-05-12 NOTE — Telephone Encounter (Signed)
 Pt has been rescheduled to 05/16/2024 at 2:15 for [facility] Nurse Intake.

## 2024-05-15 ENCOUNTER — Encounter (HOSPITAL_COMMUNITY): Payer: Self-pay

## 2024-05-15 ENCOUNTER — Inpatient Hospital Stay (HOSPITAL_COMMUNITY)
Admission: AD | Admit: 2024-05-15 | Discharge: 2024-05-15 | Disposition: A | Discharge status: Home / Self Care | Attending: Obstetrics & Gynecology | Admitting: Obstetrics & Gynecology

## 2024-05-15 ENCOUNTER — Inpatient Hospital Stay (HOSPITAL_COMMUNITY)

## 2024-05-15 ENCOUNTER — Other Ambulatory Visit: Payer: Self-pay

## 2024-05-15 DIAGNOSIS — B9689 Other specified bacterial agents as the cause of diseases classified elsewhere: Secondary | ICD-10-CM | POA: Diagnosis not present

## 2024-05-15 DIAGNOSIS — R109 Unspecified abdominal pain: Secondary | ICD-10-CM | POA: Diagnosis not present

## 2024-05-15 DIAGNOSIS — O23592 Infection of other part of genital tract in pregnancy, second trimester: Secondary | ICD-10-CM | POA: Insufficient documentation

## 2024-05-15 DIAGNOSIS — O26891 Other specified pregnancy related conditions, first trimester: Secondary | ICD-10-CM | POA: Diagnosis not present

## 2024-05-15 DIAGNOSIS — Z3A14 14 weeks gestation of pregnancy: Secondary | ICD-10-CM | POA: Diagnosis not present

## 2024-05-15 DIAGNOSIS — Z3491 Encounter for supervision of normal pregnancy, unspecified, first trimester: Secondary | ICD-10-CM

## 2024-05-15 DIAGNOSIS — Z3A08 8 weeks gestation of pregnancy: Secondary | ICD-10-CM | POA: Diagnosis not present

## 2024-05-15 DIAGNOSIS — N760 Acute vaginitis: Secondary | ICD-10-CM | POA: Diagnosis not present

## 2024-05-15 DIAGNOSIS — O26892 Other specified pregnancy related conditions, second trimester: Secondary | ICD-10-CM | POA: Diagnosis not present

## 2024-05-15 DIAGNOSIS — O219 Vomiting of pregnancy, unspecified: Secondary | ICD-10-CM | POA: Insufficient documentation

## 2024-05-15 LAB — WET PREP, GENITAL
Sperm: NONE SEEN
Trich, Wet Prep: NONE SEEN
WBC, Wet Prep HPF POC: >=10 — AB (ref <10)
Yeast Wet Prep HPF POC: NONE SEEN

## 2024-05-15 LAB — URINALYSIS, ROUTINE W REFLEX MICROSCOPIC
Bilirubin Urine: NEGATIVE
Glucose, UA: NEGATIVE mg/dL
Hgb urine dipstick: NEGATIVE
Ketones, ur: 5 mg/dL — AB
Leukocytes,Ua: NEGATIVE
Nitrite: NEGATIVE
Protein, ur: NEGATIVE mg/dL
Specific Gravity, Urine: 1.021 (ref 1.005–1.030)
pH: 8 (ref 5.0–8.0)

## 2024-05-15 LAB — CBC
HCT: 35.5 % — ABNORMAL LOW (ref 36.0–46.0)
Hemoglobin: 10.9 g/dL — ABNORMAL LOW (ref 12.0–15.0)
MCH: 18.6 pg — ABNORMAL LOW (ref 26.0–34.0)
MCHC: 30.7 g/dL (ref 30.0–36.0)
MCV: 60.7 fL — ABNORMAL LOW (ref 80.0–100.0)
Platelets: 227 K/uL (ref 150–400)
RBC: 5.85 MIL/uL — ABNORMAL HIGH (ref 3.87–5.11)
RDW: 17.7 % — ABNORMAL HIGH (ref 11.5–15.5)
WBC: 12.4 K/uL — ABNORMAL HIGH (ref 4.0–10.5)
nRBC: 0 % (ref 0.0–0.2)

## 2024-05-15 LAB — ABO/RH: ABO/RH(D): A POS

## 2024-05-15 LAB — HCG, QUANTITATIVE, PREGNANCY: hCG, Beta Chain, Quant, S: 60537 m[IU]/mL — ABNORMAL HIGH (ref <5)

## 2024-05-15 MED ORDER — METOCLOPRAMIDE HCL 10 MG PO TABS: 10.0000 mg | ORAL_TABLET | Freq: Four times a day (QID) | ORAL | 2 refills | Status: AC

## 2024-05-15 MED ORDER — ONDANSETRON 4 MG PO TBDP
8.0000 mg | ORAL_TABLET | Freq: Once | ORAL | Status: AC
  Administered 2024-05-15: 8 mg via ORAL
  Filled 2024-05-15: qty 2

## 2024-05-15 MED ORDER — PROMETHAZINE HCL 25 MG PO TABS
25.0000 mg | ORAL_TABLET | Freq: Four times a day (QID) | ORAL | 2 refills | Status: AC | PRN
Start: 2024-05-15 — End: ?

## 2024-05-15 MED ORDER — METRONIDAZOLE 500 MG PO TABS: 500.0000 mg | ORAL_TABLET | Freq: Two times a day (BID) | ORAL | 0 refills | Status: DC

## 2024-05-15 MED ORDER — ONDANSETRON 8 MG PO TBDP: 8.0000 mg | ORAL_TABLET | Freq: Three times a day (TID) | ORAL | 2 refills | Status: AC | PRN

## 2024-05-15 NOTE — MAU Provider Note (Signed)
 History     CSN: [id number]  Arrival date and time: 05/15/24 1539   None     Chief Complaint  Patient presents with   Abdominal Pain   Emesis   HPI  Michele Vasquez is a 23 y.o. G1P0 at [redacted]w[redacted]d who presents for evaluation of abdominal cramping. Patient reports she is having on and off cramping for a few weeks. Patient rates the pain as a 5/10 and has not tried anything for the pain. She denies any vaginal bleeding and discharge. Denies any constipation, diarrhea or any urinary complaints.   OB History     Gravida  1   Para      Term      Preterm      AB      Living         SAB      IAB      Ectopic      Multiple      Live Births              Past Medical History:  Diagnosis Date   Asthma    Eczema    Seasonal allergies     Past Surgical History:  Procedure Laterality Date   CHOLECYSTECTOMY     TONSILLECTOMY      Family History  Problem Relation Age of Onset   Hypertension Mother    Migraines Mother    Asthma Father    Heart disease Father    Hypertension Father    Breast cancer Maternal Grandmother    Colon cancer Maternal Grandmother    Stomach cancer Maternal Grandmother    Diabetes Maternal Grandmother     Social History   Tobacco Use   Smoking status: Every Day    Current packs/day: 0.50    Types: Cigarettes   Smokeless tobacco: Never  Vaping Use   Vaping status: Never Used  Substance Use Topics   Alcohol use: Yes    Comment: soc   Drug use: Not Currently    Types: Marijuana    Allergies:  Allergies  Allergen Reactions   Cat Dander Other (See Comments)    Asthma flares up    Medications Prior to Admission  Medication Sig Dispense Refill Last Dose/Taking   albuterol  (VENTOLIN  HFA) 108 (90 Base) MCG/ACT inhaler Inhale 1-2 puffs into the lungs every 6 (six) hours as needed. 1 each 0    amoxicillin -clavulanate (AUGMENTIN ) 875-125 MG tablet Take 1 tablet by mouth every 12 (twelve) hours. 14 tablet 0     AZO-CRANBERRY PO Take by mouth.      cetirizine (ZYRTEC) 10 MG tablet Take 10 mg by mouth daily.      fluticasone  (FLONASE ) 50 MCG/ACT nasal spray Place 2 sprays into both nostrils daily. 16 g 0    hydrOXYzine  (ATARAX ) 25 MG tablet Take 0.5-1 tablets (12.5-25 mg total) by mouth every 8 (eight) hours as needed for itching. 30 tablet 0    lidocaine  (LIDODERM ) 5 % Place 1 patch onto the skin daily as needed. Apply patch to area most significant pain once per day.  Remove and discard patch within 12 hours of application. 15 patch 0    loratadine  (CLARITIN ) 10 MG tablet Take 1 tablet (10 mg total) by mouth daily. 30 tablet 0    montelukast  (SINGULAIR ) 10 MG tablet Take 1 tablet (10 mg total) by mouth at bedtime. 30 tablet 0    naproxen  (NAPROSYN ) 500 MG tablet Take 1 tablet (500 mg total) by  mouth 2 (two) times daily as needed for moderate pain. 15 tablet 0    nitrofurantoin , macrocrystal-monohydrate, (MACROBID ) 100 MG capsule Take 1 capsule (100 mg total) by mouth 2 (two) times daily. 10 capsule 0    permethrin  (ELIMITE ) 5 % cream Thoroughly massage cream (30 g for average adult) from head to soles of feet; leave on for 8 to 14 hours before removing (shower or bath). 60 g 0    promethazine -dextromethorphan (PROMETHAZINE -DM) 6.25-15 MG/5ML syrup Take 5 mLs by mouth 4 (four) times daily as needed for cough. 118 mL 0    SUMAtriptan (IMITREX) 50 MG tablet SMARTSIG:1 Tablet(s) By Mouth 1-2 Times Daily      triamcinolone  (KENALOG ) 0.025 % ointment Apply 1 Application topically 2 (two) times daily. 30 g 0     Review of Systems  Constitutional: Negative.  Negative for fatigue and fever.  HENT: Negative.    Respiratory: Negative.  Negative for shortness of breath.   Cardiovascular: Negative.  Negative for chest pain.  Gastrointestinal:  Positive for abdominal pain. Negative for constipation, diarrhea, nausea and vomiting.  Genitourinary: Negative.  Negative for dysuria, vaginal bleeding and vaginal  discharge.  Neurological: Negative.  Negative for dizziness and headaches.   Physical Exam   Blood pressure 113/68, pulse 94, temperature 98.6 F (37 C), resp. rate 12, weight 99.3 kg, last menstrual period 03/18/2024.  Patient Vitals for the past 24 hrs:  BP Temp Pulse Resp Weight  05/15/24 1554 113/68 98.6 F (37 C) 94 12 --  05/15/24 1551 -- -- -- -- 99.3 kg    Physical Exam Vitals and nursing note reviewed.  Constitutional:      General: She is not in acute distress.    Appearance: She is well-developed.  HENT:     Head: Normocephalic.  Eyes:     Pupils: Pupils are equal, round, and reactive to light.  Cardiovascular:     Rate and Rhythm: Normal rate and regular rhythm.     Heart sounds: Normal heart sounds.  Pulmonary:     Effort: Pulmonary effort is normal. No respiratory distress.     Breath sounds: Normal breath sounds.  Abdominal:     General: Bowel sounds are normal. There is no distension.     Palpations: Abdomen is soft.     Tenderness: There is no abdominal tenderness.  Skin:    General: Skin is warm and dry.  Neurological:     Mental Status: She is alert and oriented to person, place, and time.  Psychiatric:        Mood and Affect: Mood normal.        Behavior: Behavior normal.        Thought Content: Thought content normal.        Judgment: Judgment normal.     MAU Course  Procedures  Results for orders placed or performed during the hospital encounter of 05/15/24 (from the past 24 hours)  Urinalysis, Routine w reflex microscopic -Urine, Clean Catch     Status: Abnormal   Collection Time: 05/15/24  3:57 PM  Result Value Ref Range   Color, Urine YELLOW YELLOW   APPearance CLEAR CLEAR   Specific Gravity, Urine 1.021 1.005 - 1.030   pH 8.0 5.0 - 8.0   Glucose, UA NEGATIVE NEGATIVE mg/dL   Hgb urine dipstick NEGATIVE NEGATIVE   Bilirubin Urine NEGATIVE NEGATIVE   Ketones, ur 5 (A) NEGATIVE mg/dL   Protein, ur NEGATIVE NEGATIVE mg/dL   Nitrite  NEGATIVE NEGATIVE  Leukocytes,Ua NEGATIVE NEGATIVE  ABO/Rh     Status: None   Collection Time: 05/15/24  4:11 PM  Result Value Ref Range   ABO/RH(D) A POS    No rh immune globuloin      NOT A RH IMMUNE GLOBULIN CANDIDATE, PT RH POSITIVE Performed at [facility], [address], [place], [place] [address]   CBC     Status: Abnormal   Collection Time: 05/15/24  4:13 PM  Result Value Ref Range   WBC 12.4 (H) 4.0 - 10.5 K/uL   RBC 5.85 (H) 3.87 - 5.11 MIL/uL   Hemoglobin 10.9 (L) 12.0 - 15.0 g/dL   HCT 64.4 (L) 63.9 - 53.9 %   MCV 60.7 (L) 80.0 - 100.0 fL   MCH 18.6 (L) 26.0 - 34.0 pg   MCHC 30.7 30.0 - 36.0 g/dL   RDW 82.2 (H) 88.4 - 84.4 %   Platelets 227 150 - 400 K/uL   nRBC 0.0 0.0 - 0.2 %  hCG, quantitative, pregnancy     Status: Abnormal   Collection Time: 05/15/24  4:13 PM  Result Value Ref Range   hCG, Beta Chain, Quant, S 60,537 (H) <5 mIU/mL  Wet prep, genital     Status: Abnormal   Collection Time: 05/15/24  5:10 PM   Specimen: PATH Cytology Cervicovaginal Ancillary Only  Result Value Ref Range   Yeast Wet Prep HPF POC NONE SEEN NONE SEEN   Trich, Wet Prep NONE SEEN NONE SEEN   Clue Cells Wet Prep HPF POC PRESENT (A) NONE SEEN   WBC, Wet Prep HPF POC >=10 (A) <10   Sperm NONE SEEN      US  OB LESS THAN 14 WEEKS WITH OB TRANSVAGINAL Result Date: 05/15/2024 CLINICAL DATA:  Abdominal pain. Beta hCG is not available at the time of interpretation. EXAM: OBSTETRIC <14 WK US  AND TRANSVAGINAL OB US  TECHNIQUE: Both transabdominal and transvaginal ultrasound examinations were performed for complete evaluation of the gestation as well as the maternal uterus, adnexal regions, and pelvic cul-de-sac. Transvaginal technique was performed to assess early pregnancy. COMPARISON:  None Available. FINDINGS: Intrauterine gestational sac: Single Yolk sac:  Visualized. Embryo:  Visualized. Cardiac Activity: Visualized. Heart Rate: 160 bpm CRL:  13.8 mm   7 w   5 d                  US   EDC: 12/27/2024 Subchorionic hemorrhage:  None visualized. Maternal uterus/adnexae: Presumed corpus luteum within the left ovary. Heterogeneously echogenic mass in the right adnexa measuring 4.2 x 3.7 x 3.6 cm without associated vascularity on color Doppler evaluation. IMPRESSION: 1. Single intrauterine pregnancy with cardiac activity measures 7 weeks 5 days' gestation by crown-rump length. 2. Heterogeneously echogenic avascular right adnexal mass measuring 4.2 cm may represent an exophytic leiomyoma or ovarian dermoid. Recommend short-term follow-up ultrasound examination to ensure stability. Electronically Signed   By: Limin  Xu M.D.   On: 05/15/2024 17:16     MDM Labs ordered and reviewed.   UA, UPT CBC, HCG ABO/Rh- A Pos Wet prep and gc/chlamydia US  OB Comp Less 14 weeks with Transvaginal  CNM independently reviewed the imaging ordered. Imaging show Live IUP consistent with dates  Assessment and Plan   1. Normal intrauterine pregnancy on prenatal ultrasound in first trimester   2. [redacted] weeks gestation of pregnancy   3. BV (bacterial vaginosis)   4. Nausea/vomiting in pregnancy     -Discharge home in stable condition -Rx for metronidazole  and  antiemetics sent to pharmacy -First trimester precautions discussed -Patient advised to follow-up with OB as scheduled for prenatal care -Patient may return to MAU as needed or if her condition were to change or worsen  Aleck CHRISTELLA Fireman, CNM 05/15/2024, 5:52 PM

## 2024-05-15 NOTE — Discharge Instructions (Signed)

## 2024-05-15 NOTE — MAU Note (Addendum)
.  Michele Vasquez is a 23 y.o. at Unknown here in MAU reporting: 0630-0700 symptoms of nausea vomiting feeling sweaty and abdominal pain  Sharp chest pain and sob this, but is okay now  LMP: 03/18/2024 Onset of complaint: today  Pain score: 7/10 There were no vitals filed for this visit.    Lab orders placed from triage:   ua

## 2024-05-16 ENCOUNTER — Telehealth (INDEPENDENT_AMBULATORY_CARE_PROVIDER_SITE_OTHER)

## 2024-05-16 DIAGNOSIS — Z3480 Encounter for supervision of other normal pregnancy, unspecified trimester: Secondary | ICD-10-CM

## 2024-05-16 DIAGNOSIS — Z3687 Encounter for antenatal screening for uncertain dates: Secondary | ICD-10-CM

## 2024-05-16 LAB — RPR
RPR Ser Ql: REACTIVE — AB
RPR Titer: 1:2 {titer}

## 2024-05-16 LAB — GC/CHLAMYDIA PROBE AMP (~~LOC~~) NOT AT ARMC
Chlamydia: NEGATIVE
Comment: NEGATIVE
Comment: NORMAL
Neisseria Gonorrhea: NEGATIVE

## 2024-05-16 NOTE — Progress Notes (Signed)
 New OB Intake  I connected with  Michele Vasquez on 05/16/24 at  2:15 PM EDT by MyChart Video Visit and verified that I am speaking with the correct person using two identifiers. Nurse is located at [facility] and pt is located at home.  I discussed the limitations, risks, security and privacy concerns of performing an evaluation and management service by telephone and the availability of in person appointments. I also discussed with the patient that there may be a patient responsible charge related to this service. The patient expressed understanding and agreed to proceed.  I explained I am completing New OB Intake today. We discussed her EDD of 12/27/2024 that is based on us  on 10/12/2025Pt is G2/P1 I reviewed her allergies, medications, Medical/Surgical/OB history, and appropriate screenings. There are cats in the home: no. Based on history this pregnancy is significant for gallstones last pregancy and had gallbladder removed and sever athsma .  Patient Active Problem List   Diagnosis Date Noted   Supervision of other normal pregnancy, antepartum 05/16/2024    Concerns addressed today:   Delivery Plans:  Plans to deliver at [facility].  Anatomy US  Explained first scheduled US  .Anatomy US  will be scheduled around [redacted] weeks gestational age.  Labs Discussed genetic screening with patient. Patient would like genetic testing to be drawn at new OB visit. Discussed possible labs to be drawn at new OB appointment.  COVID Vaccine Patient has had COVID vaccine.   Social Determinants of Health Food Insecurity: denies food insecurity Transportation: Patient denies transportation needs. Childcare: Discussed no children allowed at ultrasound appointments.   First visit review I reviewed new OB appt with pt. I explained she will have blood work and pap smear/pelvic exam if indicated. Explained pt will be seen by [facility] MD at first visit; encounter routed to  appropriate provider.   Michele Vasquez, CMA 05/16/2024  2:42 PM

## 2024-05-17 LAB — T.PALLIDUM AB, TOTAL: T Pallidum Abs: REACTIVE — AB

## 2024-05-18 ENCOUNTER — Ambulatory Visit

## 2024-05-25 ENCOUNTER — Encounter: Admitting: Obstetrics & Gynecology

## 2024-05-26 ENCOUNTER — Encounter: Admitting: Obstetrics

## 2024-06-07 ENCOUNTER — Other Ambulatory Visit (HOSPITAL_COMMUNITY)
Admission: RE | Admit: 2024-06-07 | Discharge: 2024-06-07 | Disposition: A | Source: Ambulatory Visit | Attending: Obstetrics & Gynecology | Admitting: Obstetrics & Gynecology

## 2024-06-07 ENCOUNTER — Ambulatory Visit (INDEPENDENT_AMBULATORY_CARE_PROVIDER_SITE_OTHER): Admitting: Obstetrics & Gynecology

## 2024-06-07 VITALS — BP 102/67 | HR 90 | Wt 220.3 lb

## 2024-06-07 DIAGNOSIS — Z3A11 11 weeks gestation of pregnancy: Secondary | ICD-10-CM | POA: Diagnosis not present

## 2024-06-07 DIAGNOSIS — Z1379 Encounter for other screening for genetic and chromosomal anomalies: Secondary | ICD-10-CM

## 2024-06-07 DIAGNOSIS — Z3480 Encounter for supervision of other normal pregnancy, unspecified trimester: Secondary | ICD-10-CM

## 2024-06-07 DIAGNOSIS — Z8619 Personal history of other infectious and parasitic diseases: Secondary | ICD-10-CM | POA: Insufficient documentation

## 2024-06-07 DIAGNOSIS — Z3401 Encounter for supervision of normal first pregnancy, first trimester: Secondary | ICD-10-CM

## 2024-06-07 DIAGNOSIS — Z130 Encounter for screening for diseases of the blood and blood-forming organs and certain disorders involving the immune mechanism: Secondary | ICD-10-CM

## 2024-06-07 DIAGNOSIS — Z0283 Encounter for blood-alcohol and blood-drug test: Secondary | ICD-10-CM

## 2024-06-07 DIAGNOSIS — Z124 Encounter for screening for malignant neoplasm of cervix: Secondary | ICD-10-CM

## 2024-06-07 DIAGNOSIS — Z3400 Encounter for supervision of normal first pregnancy, unspecified trimester: Secondary | ICD-10-CM

## 2024-06-07 DIAGNOSIS — O98119 Syphilis complicating pregnancy, unspecified trimester: Secondary | ICD-10-CM | POA: Insufficient documentation

## 2024-06-07 DIAGNOSIS — O99210 Obesity complicating pregnancy, unspecified trimester: Secondary | ICD-10-CM | POA: Insufficient documentation

## 2024-06-07 DIAGNOSIS — Z113 Encounter for screening for infections with a predominantly sexual mode of transmission: Secondary | ICD-10-CM

## 2024-06-07 MED ORDER — ASPIRIN 81 MG PO CHEW: 81.0000 mg | CHEWABLE_TABLET | Freq: Every day | ORAL | 3 refills | Status: AC

## 2024-06-07 NOTE — Progress Notes (Signed)
  Subjective:    Michele Vasquez is a single G39P1001 (24 yo son) at [redacted]w[redacted]d being seen today for her first obstetrical visit.  Her obstetrical history is significant for obesity and h/o trich and + RPR. Patient does intend to breast feed. Pregnancy history fully reviewed.  Patient reports nausea.  Vitals:   06/07/24 1411  BP: 108/74  Pulse: (!) 105  Weight: 220 lb 4.8 oz (99.9 kg)    HISTORY: OB History  Gravida Para Term Preterm AB Living  2 1 1   1   SAB IAB Ectopic Multiple Live Births      1    # Outcome Date GA Lbr Len/2nd Weight Sex Type Anes PTL Lv  2 Current           1 Term 05/18/14 [redacted]w[redacted]d / 00:23 6 lb 5.4 oz (2.874 kg) M Vag-Spont EPI N LIV   Past Medical History:  Diagnosis Date   Asthma    Eczema    Seasonal allergies    Past Surgical History:  Procedure Laterality Date   CHOLECYSTECTOMY     TONSILLECTOMY     Family History  Problem Relation Age of Onset   Hypertension Mother    Migraines Mother    Asthma Father    Heart disease Father    Hypertension Father    Breast cancer Maternal Grandmother    Colon cancer Maternal Grandmother    Stomach cancer Maternal Grandmother    Diabetes Maternal Grandmother      Exam   General:  alert   Breasts:  inspection negative, no nipple discharge or bleeding, no masses or nodularity palpable  Lungs: clear to auscultation bilaterally  Heart:  regular rate and rhythm, S1, S2 normal, no murmur, click, rub or gallop  Abdomen: soft, non-tender; bowel sounds normal; no masses,  no organomegaly   Vulva:  normal  Vagina: normal  Cervix:  No lesions, normal discharge  Corpus: 11 week size  Adnexa:  not enlarged or painful  Rectal Exam: Not performed.      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 Prenatal vitamins. Problem list reviewed and updated. Genetic Screening discussed and requested  Ultrasound discussed; fetal survey: ordered for anatomy at 19 weeks Follow up in 4 weeks. Start baby asa at 12 weeks  Rec total weight gain less than 20 pounds. Check CMP, Pr/cr Check Aptima today for TOC for trich  Harland JAYSON Birkenhead 06/07/2024

## 2024-06-08 LAB — COMPREHENSIVE METABOLIC PANEL WITH GFR
ALT: 10 IU/L (ref 0–32)
AST: 13 IU/L (ref 0–40)
Albumin: 3.9 g/dL — ABNORMAL LOW (ref 4.0–5.0)
Alkaline Phosphatase: 50 IU/L (ref 41–116)
BUN/Creatinine Ratio: 17 (ref 9–23)
BUN: 9 mg/dL (ref 6–20)
Bilirubin Total: 0.7 mg/dL (ref 0.0–1.2)
CO2: 19 mmol/L — ABNORMAL LOW (ref 20–29)
Calcium: 8.7 mg/dL (ref 8.7–10.2)
Chloride: 103 mmol/L (ref 96–106)
Creatinine, Ser: 0.52 mg/dL — ABNORMAL LOW (ref 0.57–1.00)
Globulin, Total: 2.5 g/dL (ref 1.5–4.5)
Glucose: 69 mg/dL — ABNORMAL LOW (ref 70–99)
Potassium: 4 mmol/L (ref 3.5–5.2)
Sodium: 135 mmol/L (ref 134–144)
Total Protein: 6.4 g/dL (ref 6.0–8.5)
eGFR: 133 mL/min/1.73 (ref >59)

## 2024-06-08 LAB — VARICELLA ZOSTER ANTIBODY, IGG: Varicella zoster IgG: REACTIVE

## 2024-06-08 LAB — RUBELLA SCREEN: Rubella Antibodies, IGG: 13.9 {index} (ref >0.99)

## 2024-06-08 LAB — HEPATITIS C ANTIBODY: Hep C Virus Ab: NONREACTIVE

## 2024-06-08 LAB — HIV ANTIBODY (ROUTINE TESTING W REFLEX): HIV Screen 4th Generation wRfx: NONREACTIVE

## 2024-06-08 LAB — HEMOGLOBIN A1C
Est. average glucose Bld gHb Est-mCnc: 85 mg/dL
Hgb A1c MFr Bld: 4.6 % — ABNORMAL LOW (ref 4.8–5.6)

## 2024-06-08 LAB — PROTEIN / CREATININE RATIO, URINE
Creatinine, Urine: 126.6 mg/dL
Protein, Ur: 7.2 mg/dL
Protein/Creat Ratio: 57 mg/g{creat} (ref 0–200)

## 2024-06-08 LAB — ANTIBODY SCREEN: Antibody Screen: NEGATIVE

## 2024-06-08 LAB — HEPATITIS B SURFACE ANTIGEN: Hepatitis B Surface Ag: NEGATIVE

## 2024-06-09 LAB — CERVICOVAGINAL ANCILLARY ONLY
Bacterial Vaginitis (gardnerella): NEGATIVE
Candida Glabrata: NEGATIVE
Candida Vaginitis: NEGATIVE
Chlamydia: NEGATIVE
Comment: NEGATIVE
Comment: NEGATIVE
Comment: NEGATIVE
Comment: NEGATIVE
Comment: NEGATIVE
Comment: NORMAL
Neisseria Gonorrhea: NEGATIVE
Trichomonas: NEGATIVE

## 2024-06-09 LAB — URINE CULTURE, OB REFLEX

## 2024-06-09 LAB — CULTURE, OB URINE

## 2024-06-10 LAB — CYTOLOGY - PAP
Chlamydia: NEGATIVE
Comment: NEGATIVE
Comment: NORMAL
Diagnosis: NEGATIVE
Neisseria Gonorrhea: NEGATIVE

## 2024-06-12 LAB — MATERNIT 21 PLUS CORE, BLOOD
Fetal Fraction: 18
Result (T21): NEGATIVE
Trisomy 13 (Patau syndrome): NEGATIVE
Trisomy 18 (Edwards syndrome): NEGATIVE
Trisomy 21 (Down syndrome): NEGATIVE

## 2024-06-13 LAB — CANNABINOID (GC/MS), URINE
Cannabinoid: POSITIVE — AB
Carboxy THC (GC/MS): 563 ng/mL

## 2024-06-13 LAB — MONITOR DRUG PROFILE 14(MW)
Amphetamine Scrn, Ur: NEGATIVE ng/mL
BARBITURATE SCREEN URINE: NEGATIVE ng/mL
BENZODIAZEPINE SCREEN, URINE: NEGATIVE ng/mL
Buprenorphine, Urine: NEGATIVE ng/mL
Creatinine(Crt), U: 127.8 mg/dL (ref 20.0–300.0)
Fentanyl, Urine: NEGATIVE pg/mL
Meperidine Screen, Urine: NEGATIVE ng/mL
Methadone Screen, Urine: NEGATIVE ng/mL
OXYCODONE+OXYMORPHONE UR QL SCN: NEGATIVE ng/mL
Opiate Scrn, Ur: NEGATIVE ng/mL
Ph of Urine: 7.3 (ref 4.5–8.9)
Phencyclidine Qn, Ur: NEGATIVE ng/mL
Propoxyphene Scrn, Ur: NEGATIVE ng/mL
SPECIFIC GRAVITY: 1.03
Tramadol Screen, Urine: NEGATIVE ng/mL

## 2024-06-13 LAB — COCAINE (GC/MS), URINE
BENZOYLECGONINE (GC/MS): 771 ng/mL
COCAINE + METABOLITE: POSITIVE — AB

## 2024-06-13 LAB — NICOTINE SCREEN, URINE: Cotinine Ql Scrn, Ur: POSITIVE ng/mL — AB

## 2024-06-14 ENCOUNTER — Encounter: Payer: Self-pay | Admitting: Obstetrics & Gynecology

## 2024-06-14 DIAGNOSIS — O99321 Drug use complicating pregnancy, first trimester: Secondary | ICD-10-CM | POA: Insufficient documentation

## 2024-07-01 ENCOUNTER — Telehealth: Admitting: Family Medicine

## 2024-07-01 DIAGNOSIS — J069 Acute upper respiratory infection, unspecified: Secondary | ICD-10-CM | POA: Diagnosis not present

## 2024-07-01 DIAGNOSIS — J4521 Mild intermittent asthma with (acute) exacerbation: Secondary | ICD-10-CM | POA: Diagnosis not present

## 2024-07-01 MED ORDER — ALBUTEROL SULFATE HFA 108 (90 BASE) MCG/ACT IN AERS
1.0000 | INHALATION_SPRAY | Freq: Four times a day (QID) | RESPIRATORY_TRACT | 0 refills | Status: AC | PRN
Start: 2024-07-01 — End: ?

## 2024-07-01 MED ORDER — FLUTICASONE PROPIONATE 50 MCG/ACT NA SUSP: 2.0000 | Freq: Every day | NASAL | 0 refills | Status: AC

## 2024-07-01 MED ORDER — GUAIFENESIN 200 MG PO TABS: 400.0000 mg | ORAL_TABLET | Freq: Four times a day (QID) | ORAL | 0 refills | Status: AC | PRN

## 2024-07-01 NOTE — Progress Notes (Signed)
 Virtual Visit Consent   Michele Vasquez, you are scheduled for a virtual visit with a [facility] provider today. Just as with appointments in the office, your consent must be obtained to participate. Your consent will be active for this visit and any virtual visit you may have with one of our providers in the next 365 days. If you have a MyChart account, a copy of this consent can be sent to you electronically.  As this is a virtual visit, video technology does not allow for your provider to perform a traditional examination. This may limit your provider's ability to fully assess your condition. If your provider identifies any concerns that need to be evaluated in person or the need to arrange testing (such as labs, EKG, etc.), we will make arrangements to do so. Although advances in technology are sophisticated, we cannot ensure that it will always work on either your end or our end. If the connection with a video visit is poor, the visit may have to be switched to a telephone visit. With either a video or telephone visit, we are not always able to ensure that we have a secure connection.  By engaging in this virtual visit, you consent to the provision of healthcare and authorize for your insurance to be billed (if applicable) for the services provided during this visit. Depending on your insurance coverage, you may receive a charge related to this service.  I need to obtain your verbal consent now. Are you willing to proceed with your visit today? Michele Vasquez has provided verbal consent on 07/01/2024 for a virtual visit (video or telephone). Michele Vasquez, [place]  Date: 07/01/2024 4:54 PM   Virtual Visit via Video Note   I, Michele Vasquez, connected with  Michele Vasquez  ([id number], Jun 19, 2001) on 07/01/24 at  4:45 PM EST by a video-enabled telemedicine application and verified that I am speaking with the correct person using two identifiers.  Location: Patient:  Virtual Visit Location Patient: Home Provider: Virtual Visit Location Provider: Home Office   I discussed the limitations of evaluation and management by telemedicine and the availability of in person appointments. The patient expressed understanding and agreed to proceed.    History of Present Illness: Michele Vasquez is a 23 y.o. who identifies as a female who was assigned female at birth, and is being seen today for c/o having a respiratory infection that started two days ago.  Pt states coughing and mucus and gums hurting and bleeding.  Pt states she thinks she has an infection in her gums. Pt denies fever, chills, but does report some night sweating.  Pt states she is currently pregnant. Pt is requesting refills on her inhaler.   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, antepartum 06/07/2024   History of trichomoniasis 06/07/2024   Supervision of other normal pregnancy, antepartum 05/16/2024    Allergies:  Allergies  Allergen Reactions   Cat Dander Other (See Comments)    Asthma flares up   Medications:  Current Outpatient Medications:    guaiFENesin 200 MG tablet, Take 2 tablets (400 mg total) by mouth every 6 (six) hours as needed for up to 7 days for cough or to loosen phlegm., Disp: 30 tablet, Rfl: 0   albuterol  (VENTOLIN  HFA) 108 (90 Base) MCG/ACT inhaler, Inhale 1-2 puffs into the lungs  every 6 (six) hours as needed., Disp: 1 each, Rfl: 0   aspirin  81 MG chewable tablet, Chew 1 tablet (81 mg total) by mouth daily., Disp: 90 tablet, Rfl: 3   AZO-CRANBERRY PO, Take by mouth., Disp: , Rfl:    cetirizine (ZYRTEC) 10 MG tablet, Take 10 mg by mouth daily., Disp: , Rfl:    fluticasone  (FLONASE ) 50 MCG/ACT nasal spray, Place 2 sprays into both nostrils daily., Disp: 16 g, Rfl: 0   hydrOXYzine  (ATARAX ) 25 MG tablet, Take  0.5-1 tablets (12.5-25 mg total) by mouth every 8 (eight) hours as needed for itching., Disp: 30 tablet, Rfl: 0   lidocaine  (LIDODERM ) 5 %, Place 1 patch onto the skin daily as needed. Apply patch to area most significant pain once per day.  Remove and discard patch within 12 hours of application., Disp: 15 patch, Rfl: 0   loratadine  (CLARITIN ) 10 MG tablet, Take 1 tablet (10 mg total) by mouth daily., Disp: 30 tablet, Rfl: 0   metoCLOPramide  (REGLAN ) 10 MG tablet, Take 1 tablet (10 mg total) by mouth every 6 (six) hours., Disp: 30 tablet, Rfl: 2   metroNIDAZOLE  (FLAGYL ) 500 MG tablet, Take 1 tablet (500 mg total) by mouth 2 (two) times daily. (Patient not taking: Reported on 06/07/2024), Disp: 14 tablet, Rfl: 0   montelukast  (SINGULAIR ) 10 MG tablet, Take 1 tablet (10 mg total) by mouth at bedtime., Disp: 30 tablet, Rfl: 0   ondansetron  (ZOFRAN -ODT) 8 MG disintegrating tablet, Take 1 tablet (8 mg total) by mouth every 8 (eight) hours as needed., Disp: 30 tablet, Rfl: 2   promethazine  (PHENERGAN ) 25 MG tablet, Take 1 tablet (25 mg total) by mouth every 6 (six) hours as needed for nausea or vomiting., Disp: 30 tablet, Rfl: 2   promethazine -dextromethorphan (PROMETHAZINE -DM) 6.25-15 MG/5ML syrup, Take 5 mLs by mouth 4 (four) times daily as needed for cough., Disp: 118 mL, Rfl: 0   SUMAtriptan (IMITREX) 50 MG tablet, SMARTSIG:1 Tablet(s) By Mouth 1-2 Times Daily, Disp: , Rfl:    triamcinolone  (KENALOG ) 0.025 % ointment, Apply 1 Application topically 2 (two) times daily., Disp: 30 g, Rfl: 0  Observations/Objective: Patient is well-developed, well-nourished in no acute distress.  Resting comfortably at home.  Head is normocephalic, atraumatic.  No labored breathing.  Speech is clear and coherent with logical content.  Patient is alert and oriented at baseline.    Assessment and Plan: 1. Upper respiratory tract infection, unspecified type (Primary) - fluticasone  (FLONASE ) 50 MCG/ACT nasal spray;  Place 2 sprays into both nostrils daily.  Dispense: 16 g; Refill: 0 - guaiFENesin 200 MG tablet; Take 2 tablets (400 mg total) by mouth every 6 (six) hours as needed for up to 7 days for cough or to loosen phlegm.  Dispense: 30 tablet; Refill: 0  2. Mild intermittent asthma with acute exacerbation - albuterol  (VENTOLIN  HFA) 108 (90 Base) MCG/ACT inhaler; Inhale 1-2 puffs into the lungs every 6 (six) hours as needed.  Dispense: 1 each; Refill: 0  -Pt advised to follow up and notify OBGYN of her current symptoms -Advised warm salt water gargles for gum pain  -Pt advised to follow up in person urgent care or with OBGYN for worsening symptoms  Follow Up Instructions: I discussed the assessment and treatment plan with the patient. The patient was provided an opportunity to ask questions and all were answered. The patient agreed with the plan and demonstrated an understanding of the instructions.  A copy of instructions were sent to the patient  via MyChart unless otherwise noted below.    The patient was advised to call back or seek an in-person evaluation if the symptoms worsen or if the condition fails to improve as anticipated.    Michele Mater, PA-C

## 2024-07-01 NOTE — Patient Instructions (Signed)
 Michele Vasquez, thank you for joining [name], PA-C for today's virtual visit.  While this provider is not your primary care provider (PCP), if your PCP is located in our provider database this encounter information will be shared with them immediately following your visit.   A [facility] MyChart account gives you access to today's visit and all your visits, tests, and labs performed at [facility]  click here if you don't have a  MyChart account or go to [email or web address]  Consent: (Patient) Michele Vasquez provided verbal consent for this virtual visit at the beginning of the encounter.  Current Medications:  Current Outpatient Medications:    guaiFENesin  200 MG tablet, Take 2 tablets (400 mg total) by mouth every 6 (six) hours as needed for up to 7 days for cough or to loosen phlegm., Disp: 30 tablet, Rfl: 0   albuterol  (VENTOLIN  HFA) 108 (90 Base) MCG/ACT inhaler, Inhale 1-2 puffs into the lungs every 6 (six) hours as needed., Disp: 1 each, Rfl: 0   aspirin  81 MG chewable tablet, Chew 1 tablet (81 mg total) by mouth daily., Disp: 90 tablet, Rfl: 3   AZO-CRANBERRY PO, Take by mouth., Disp: , Rfl:    cetirizine (ZYRTEC) 10 MG tablet, Take 10 mg by mouth daily., Disp: , Rfl:    fluticasone  (FLONASE ) 50 MCG/ACT nasal spray, Place 2 sprays into both nostrils daily., Disp: 16 g, Rfl: 0   hydrOXYzine  (ATARAX ) 25 MG tablet, Take 0.5-1 tablets (12.5-25 mg total) by mouth every 8 (eight) hours as needed for itching., Disp: 30 tablet, Rfl: 0   lidocaine  (LIDODERM ) 5 %, Place 1 patch onto the skin daily as needed. Apply patch to area most significant pain once per day.  Remove and discard patch within 12 hours of application., Disp: 15 patch, Rfl: 0   loratadine  (CLARITIN ) 10 MG tablet, Take 1 tablet (10 mg total) by mouth daily., Disp: 30 tablet, Rfl: 0   metoCLOPramide  (REGLAN ) 10 MG tablet, Take 1 tablet (10 mg total) by mouth every 6 (six)  hours., Disp: 30 tablet, Rfl: 2   metroNIDAZOLE  (FLAGYL ) 500 MG tablet, Take 1 tablet (500 mg total) by mouth 2 (two) times daily. (Patient not taking: Reported on 06/07/2024), Disp: 14 tablet, Rfl: 0   montelukast  (SINGULAIR ) 10 MG tablet, Take 1 tablet (10 mg total) by mouth at bedtime., Disp: 30 tablet, Rfl: 0   ondansetron  (ZOFRAN -ODT) 8 MG disintegrating tablet, Take 1 tablet (8 mg total) by mouth every 8 (eight) hours as needed., Disp: 30 tablet, Rfl: 2   promethazine  (PHENERGAN ) 25 MG tablet, Take 1 tablet (25 mg total) by mouth every 6 (six) hours as needed for nausea or vomiting., Disp: 30 tablet, Rfl: 2   promethazine -dextromethorphan (PROMETHAZINE -DM) 6.25-15 MG/5ML syrup, Take 5 mLs by mouth 4 (four) times daily as needed for cough., Disp: 118 mL, Rfl: 0   SUMAtriptan (IMITREX) 50 MG tablet, SMARTSIG:1 Tablet(s) By Mouth 1-2 Times Daily, Disp: , Rfl:    triamcinolone  (KENALOG ) 0.025 % ointment, Apply 1 Application topically 2 (two) times daily., Disp: 30 g, Rfl: 0   Medications ordered in this encounter:  Meds ordered this encounter  Medications   fluticasone  (FLONASE ) 50 MCG/ACT nasal spray    Sig: Place 2 sprays into both nostrils daily.    Dispense:  16 g    Refill:  0   albuterol  (VENTOLIN  HFA) 108 (90 Base) MCG/ACT inhaler    Sig: Inhale 1-2 puffs into the lungs every 6 (six)  hours as needed.    Dispense:  1 each    Refill:  0   guaiFENesin 200 MG tablet    Sig: Take 2 tablets (400 mg total) by mouth every 6 (six) hours as needed for up to 7 days for cough or to loosen phlegm.    Dispense:  30 tablet    Refill:  0     *If you need refills on other medications prior to your next appointment, please contact your pharmacy*  Follow-Up: Call back or seek an in-person evaluation if the symptoms worsen or if the condition fails to improve as anticipated.  [facility] [phone]  Other Instructions Upper Respiratory Infection, Adult An upper respiratory  infection (URI) is a common viral infection of the nose, throat, and upper air passages that lead to the lungs. The most common type of URI is the common cold. URIs usually get better on their own, without medical treatment. What are the causes? A URI is caused by a virus. You may catch a virus by: Breathing in droplets from an infected person's cough or sneeze. Touching something that has been exposed to the virus (is contaminated) and then touching your mouth, nose, or eyes. What increases the risk? You are more likely to get a URI if: You are very young or very old. You have close contact with others, such as at work, school, or a health care facility. You smoke. You have long-term (chronic) heart or lung disease. You have a weakened disease-fighting system (immune system). You have nasal allergies or asthma. You are experiencing a lot of stress. You have poor nutrition. What are the signs or symptoms? A URI usually involves some of the following symptoms: Runny or stuffy (congested) nose. Cough. Sneezing. Sore throat. Headache. Fatigue. Fever. Loss of appetite. Pain in your forehead, behind your eyes, and over your cheekbones (sinus pain). Muscle aches. Redness or irritation of the eyes. Pressure in the ears or face. How is this diagnosed? This condition may be diagnosed based on your medical history and symptoms, and a physical exam. Your health care provider may use a swab to take a mucus sample from your nose (nasal swab). This sample can be tested to determine what virus is causing the illness. How is this treated? URIs usually get better on their own within 7-10 days. Medicines cannot cure URIs, but your health care provider may recommend certain medicines to help relieve symptoms, such as: Over-the-counter cold medicines. Cough suppressants. Coughing is a type of defense against infection that helps to clear the respiratory system, so take these medicines only as  recommended by your health care provider. Fever-reducing medicines. Follow these instructions at home: Activity Rest as needed. If you have a fever, stay home from work or school until your fever is gone or until your health care provider says your URI cannot spread to other people (is no longer contagious). Your health care provider may have you wear a face mask to prevent your infection from spreading. Relieving symptoms Gargle with a mixture of salt and water 3-4 times a day or as needed. To make salt water, completely dissolve -1 tsp (3-6 g) of salt in 1 cup (237 mL) of warm water. Use a cool-mist humidifier to add moisture to the air. This can help you breathe more easily. Eating and drinking  Drink enough fluid to keep your urine pale yellow. Eat soups and other clear broths. General instructions  Take over-the-counter and prescription medicines only as  told by your health care provider. These include cold medicines, fever reducers, and cough suppressants. Do not use any products that contain nicotine  or tobacco. These products include cigarettes, chewing tobacco, and vaping devices, such as e-cigarettes. If you need help quitting, ask your health care provider. Stay away from secondhand smoke. Stay up to date on all immunizations, including the yearly (annual) flu vaccine. Keep all follow-up visits. This is important. How to prevent the spread of infection to others URIs can be contagious. To prevent the infection from spreading: Wash your hands with soap and water for at least 20 seconds. If soap and water are not available, use hand sanitizer. Avoid touching your mouth, face, eyes, or nose. Cough or sneeze into a tissue or your sleeve or elbow instead of into your hand or into the air.  Contact a health care provider if: You are getting worse instead of better. You have a fever or chills. Your mucus is brown or red. You have yellow or brown discharge coming from your  nose. You have pain in your face, especially when you bend forward. You have swollen neck glands. You have pain while swallowing. You have white areas in the back of your throat. Get help right away if: You have shortness of breath that gets worse. You have severe or persistent: Headache. Ear pain. Sinus pain. Chest pain. You have chronic lung disease along with any of the following: Making high-pitched whistling sounds when you breathe, most often when you breathe out (wheezing). Prolonged cough (more than 14 days). Coughing up blood. A change in your usual mucus. You have a stiff neck. You have changes in your: Vision. Hearing. Thinking. Mood. These symptoms may be an emergency. Get help right away. Call 911. Do not wait to see if the symptoms will go away. Do not drive yourself to the hospital. Summary An upper respiratory infection (URI) is a common infection of the nose, throat, and upper air passages that lead to the lungs. A URI is caused by a virus. URIs usually get better on their own within 7-10 days. Medicines cannot cure URIs, but your health care provider may recommend certain medicines to help relieve symptoms. This information is not intended to replace advice given to you by your health care provider. Make sure you discuss any questions you have with your health care provider. Document Revised: 02/20/2021 Document Reviewed: 02/20/2021 Elsevier Patient Education  2024 Elsevier [employer]   If you have been instructed to have an in-person evaluation today at a local Urgent Care facility, please use the link below. It will take you to a list of all of our available [facility], including address, phone number and hours of operation. Please do not delay care.  [facility]  If you or a family member do not have a primary care provider, use the link below to schedule a visit and establish care. When you choose a [facility] primary care physician  or advanced practice provider, you gain a long-term partner in health. Find a Primary Care Provider  Learn more about [facility]'s in-office and virtual care options:  - Get Care Now

## 2024-07-03 ENCOUNTER — Encounter (HOSPITAL_COMMUNITY): Payer: Self-pay

## 2024-07-03 ENCOUNTER — Emergency Department (HOSPITAL_COMMUNITY)
Admission: EM | Admit: 2024-07-03 | Discharge: 2024-07-04 | Disposition: A | Attending: Emergency Medicine | Admitting: Emergency Medicine

## 2024-07-03 ENCOUNTER — Other Ambulatory Visit: Payer: Self-pay

## 2024-07-03 DIAGNOSIS — O2392 Unspecified genitourinary tract infection in pregnancy, second trimester: Secondary | ICD-10-CM | POA: Insufficient documentation

## 2024-07-03 DIAGNOSIS — O99512 Diseases of the respiratory system complicating pregnancy, second trimester: Secondary | ICD-10-CM | POA: Diagnosis not present

## 2024-07-03 DIAGNOSIS — O219 Vomiting of pregnancy, unspecified: Secondary | ICD-10-CM | POA: Diagnosis present

## 2024-07-03 DIAGNOSIS — R8271 Bacteriuria: Secondary | ICD-10-CM | POA: Diagnosis not present

## 2024-07-03 DIAGNOSIS — J45909 Unspecified asthma, uncomplicated: Secondary | ICD-10-CM | POA: Diagnosis not present

## 2024-07-03 DIAGNOSIS — Z3A24 24 weeks gestation of pregnancy: Secondary | ICD-10-CM | POA: Diagnosis not present

## 2024-07-03 DIAGNOSIS — J4521 Mild intermittent asthma with (acute) exacerbation: Secondary | ICD-10-CM

## 2024-07-03 LAB — CBC WITH DIFFERENTIAL/PLATELET
Abs Immature Granulocytes: 0.05 K/uL (ref 0.00–0.07)
Basophils Absolute: 0 K/uL (ref 0.0–0.1)
Basophils Relative: 0 %
Eosinophils Absolute: 0.2 K/uL (ref 0.0–0.5)
Eosinophils Relative: 2 %
HCT: 33 % — ABNORMAL LOW (ref 36.0–46.0)
Hemoglobin: 10.5 g/dL — ABNORMAL LOW (ref 12.0–15.0)
Immature Granulocytes: 0 %
Lymphocytes Relative: 23 %
Lymphs Abs: 3.1 K/uL (ref 0.7–4.0)
MCH: 19.2 pg — ABNORMAL LOW (ref 26.0–34.0)
MCHC: 31.8 g/dL (ref 30.0–36.0)
MCV: 60.3 fL — ABNORMAL LOW (ref 80.0–100.0)
Monocytes Absolute: 1 K/uL (ref 0.1–1.0)
Monocytes Relative: 7 %
Neutro Abs: 9 K/uL — ABNORMAL HIGH (ref 1.7–7.7)
Neutrophils Relative %: 68 %
Platelets: 241 K/uL (ref 150–400)
RBC: 5.47 MIL/uL — ABNORMAL HIGH (ref 3.87–5.11)
RDW: 19.5 % — ABNORMAL HIGH (ref 11.5–15.5)
WBC: 13.4 K/uL — ABNORMAL HIGH (ref 4.0–10.5)
nRBC: 0 % (ref 0.0–0.2)

## 2024-07-03 LAB — COMPREHENSIVE METABOLIC PANEL WITH GFR
ALT: 18 U/L (ref 0–44)
AST: 19 U/L (ref 15–41)
Albumin: 3.9 g/dL (ref 3.5–5.0)
Alkaline Phosphatase: 63 U/L (ref 38–126)
Anion gap: 11 (ref 5–15)
BUN: 8 mg/dL (ref 6–20)
CO2: 21 mmol/L — ABNORMAL LOW (ref 22–32)
Calcium: 8.9 mg/dL (ref 8.9–10.3)
Chloride: 104 mmol/L (ref 98–111)
Creatinine, Ser: 0.56 mg/dL (ref 0.44–1.00)
GFR, Estimated: >60 mL/min (ref >60)
Glucose, Bld: 87 mg/dL (ref 70–99)
Potassium: 3.9 mmol/L (ref 3.5–5.1)
Sodium: 135 mmol/L (ref 135–145)
Total Bilirubin: 0.4 mg/dL (ref 0.0–1.2)
Total Protein: 7.1 g/dL (ref 6.5–8.1)

## 2024-07-03 LAB — LIPASE, BLOOD: Lipase: 17 U/L (ref 11–51)

## 2024-07-03 MED ORDER — ACETAMINOPHEN 500 MG PO TABS
1000.0000 mg | ORAL_TABLET | Freq: Once | ORAL | Status: AC
  Administered 2024-07-04: 1000 mg via ORAL
  Filled 2024-07-03: qty 2

## 2024-07-03 MED ORDER — LACTATED RINGERS IV BOLUS
1000.0000 mL | Freq: Once | INTRAVENOUS | Status: AC
  Administered 2024-07-03: 1000 mL via INTRAVENOUS

## 2024-07-03 MED ORDER — ONDANSETRON HCL 4 MG/2ML IJ SOLN
4.0000 mg | Freq: Once | INTRAMUSCULAR | Status: AC
  Administered 2024-07-03: 4 mg via INTRAVENOUS
  Filled 2024-07-03: qty 2

## 2024-07-03 MED ORDER — CYCLOBENZAPRINE HCL 10 MG PO TABS
10.0000 mg | ORAL_TABLET | Freq: Once | ORAL | Status: AC
  Administered 2024-07-04: 10 mg via ORAL
  Filled 2024-07-03: qty 1

## 2024-07-03 NOTE — ED Provider Notes (Signed)
  [facility] EMERGENCY DEPARTMENT AT [facility] Provider Note   CSN: [id number] Arrival date & time: 07/03/24  2245     History Chief Complaint  Patient presents with   Emesis During Pregnancy    HPI Michele Vasquez is a 23 y.o. female presenting for chief complaint of nausea and vomitting. She states that she is having a substantial diffuse syndrome. Substantial emesis per day 3-5 days Having gum pain and mouth pain.  3 months pregnant: G2P1- first pregancy complicated by biliary pathology s/p chole HX of asthma and has been using her inhalers.  Went to [facility] 6 weeks ago for similar. Trying Zofran  daily 4mg  daily.   Patient's recorded medical, surgical, social, medication list and allergies were reviewed in the Snapshot window as part of the initial history.   Review of Systems   Review of Systems  Physical Exam Updated Vital Signs BP 131/76 (BP Location: Right Arm)   Pulse 100   Temp 98.8 F (37.1 C) (Oral)   Resp 17   LMP 03/18/2024   SpO2 99%  Physical Exam   ED Course/ Medical Decision Making/ A&P    Procedures Procedures   Medications Ordered in ED Medications - No data to display  Medical Decision Making:   Michele Vasquez is a 23 y.o. female who presented to the ED today with *** detailed above.    {crccomplexity:27900} Complete initial physical exam performed, notably the patient  was ***.    Reviewed and confirmed nursing documentation for past medical history, family history, social history.    Initial Assessment:   With the patient's presentation of ***, most likely diagnosis is ***. Other diagnoses were considered including (but not limited to) ***. These are considered less likely due to history of present illness and physical exam findings.   {crccopa:27899}  Initial Plan:  ***  ***Screening labs including CBC and Metabolic panel to evaluate for infectious or metabolic etiology of disease.  ***Urinalysis with  reflex culture ordered to evaluate for UTI or relevant urologic/nephrologic pathology.  ***CXR to evaluate for structural/infectious intrathoracic pathology.  {crccardiactesting:32591::EKG to evaluate for cardiac pathology} Objective evaluation as below reviewed   Initial Study Results:   Laboratory  All laboratory results reviewed without evidence of clinically relevant pathology.   ***Exceptions include: ***   ***EKG EKG was reviewed independently. Rate, rhythm, axis, intervals all examined and without medically relevant abnormality. ST segments without concerns for elevations.    Radiology:  All images reviewed independently. ***Agree with radiology report at this time.   No results found.    Consults: Case discussed with ***.   Reassessment and Plan:   ***    ***  Clinical Impression: No diagnosis found.   Data Unavailable   Final Clinical Impression(s) / ED Diagnoses Final diagnoses:  None    Rx / DC Orders ED Discharge Orders     None

## 2024-07-03 NOTE — ED Triage Notes (Signed)
 Pt reports not being able to keep anything down x 3 days. Pt is [redacted] weeks pregnant. Pt also has bleeding gums that are sore and her right side hurts.

## 2024-07-04 ENCOUNTER — Telehealth: Payer: Self-pay

## 2024-07-04 LAB — URINALYSIS, ROUTINE W REFLEX MICROSCOPIC
Bilirubin Urine: NEGATIVE
Glucose, UA: NEGATIVE mg/dL
Hgb urine dipstick: NEGATIVE
Ketones, ur: 20 mg/dL — AB
Nitrite: NEGATIVE
Protein, ur: NEGATIVE mg/dL
Specific Gravity, Urine: 1.026 (ref 1.005–1.030)
pH: 5 (ref 5.0–8.0)

## 2024-07-04 LAB — RESP PANEL BY RT-PCR (RSV, FLU A&B, COVID)  RVPGX2
Influenza A by PCR: NEGATIVE
Influenza B by PCR: NEGATIVE
Resp Syncytial Virus by PCR: NEGATIVE
SARS Coronavirus 2 by RT PCR: NEGATIVE

## 2024-07-04 MED ORDER — BUDESONIDE 180 MCG/ACT IN AEPB: 2.0000 | INHALATION_SPRAY | Freq: Two times a day (BID) | RESPIRATORY_TRACT | 0 refills | Status: AC

## 2024-07-04 MED ORDER — CEPHALEXIN 500 MG PO CAPS: 500.0000 mg | ORAL_CAPSULE | Freq: Two times a day (BID) | ORAL | 0 refills | Status: DC

## 2024-07-04 MED ORDER — CEPHALEXIN 500 MG PO CAPS
500.0000 mg | ORAL_CAPSULE | Freq: Once | ORAL | Status: AC
  Administered 2024-07-04: 500 mg via ORAL
  Filled 2024-07-04: qty 1

## 2024-07-04 MED ORDER — IPRATROPIUM-ALBUTEROL 0.5-2.5 (3) MG/3ML IN SOLN
3.0000 mL | Freq: Once | RESPIRATORY_TRACT | Status: AC
  Administered 2024-07-04: 3 mL via RESPIRATORY_TRACT
  Filled 2024-07-04: qty 3

## 2024-07-04 MED ORDER — CYCLOBENZAPRINE HCL 10 MG PO TABS: 10.0000 mg | ORAL_TABLET | Freq: Two times a day (BID) | ORAL | 0 refills | Status: AC | PRN

## 2024-07-04 MED ORDER — ONDANSETRON HCL 4 MG PO TABS: 4.0000 mg | ORAL_TABLET | Freq: Four times a day (QID) | ORAL | 0 refills | Status: AC

## 2024-07-04 MED ORDER — BUDESONIDE 0.25 MG/2ML IN SUSP
0.2500 mg | Freq: Once | RESPIRATORY_TRACT | Status: AC
  Administered 2024-07-04: 0.25 mg via RESPIRATORY_TRACT
  Filled 2024-07-04: qty 2

## 2024-07-04 NOTE — Telephone Encounter (Signed)
 SABRA

## 2024-07-04 NOTE — ED Notes (Signed)
 PO challenged pt. With more than 3 oz of water.  No reports of nausea

## 2024-07-04 NOTE — Telephone Encounter (Signed)
 Chart reviewed. Patient seen at ED.

## 2024-07-05 ENCOUNTER — Encounter: Payer: Self-pay | Admitting: Certified Nurse Midwife

## 2024-07-05 ENCOUNTER — Ambulatory Visit: Admitting: Certified Nurse Midwife

## 2024-07-05 VITALS — BP 116/78 | HR 105 | Wt 221.1 lb

## 2024-07-05 DIAGNOSIS — Z3482 Encounter for supervision of other normal pregnancy, second trimester: Secondary | ICD-10-CM

## 2024-07-05 DIAGNOSIS — Z3A15 15 weeks gestation of pregnancy: Secondary | ICD-10-CM

## 2024-07-05 MED ORDER — MONTELUKAST SODIUM 10 MG PO TABS: 10.0000 mg | ORAL_TABLET | Freq: Every day | ORAL | 5 refills | Status: AC

## 2024-07-05 NOTE — Patient Instructions (Signed)
 Second Trimester of Pregnancy  The second trimester of pregnancy is from week 14 through week 27. This is months 4 through 6 of pregnancy. During the second trimester: Morning sickness is less or has stopped. You may have more energy. You may feel hungry more often. At this time, your unborn baby is growing very fast. At the end of the sixth month, the unborn baby may be up to 12 inches long and weigh about 1 pounds. You will likely start to feel the baby move between 16 and 20 weeks of pregnancy. Body changes during your second trimester Your body continues to change during this time. The changes usually go away after your baby is born. Physical changes You will gain more weight. Your belly will get bigger. You may begin to get stretch marks on your hips, belly, and breasts. Your breasts will keep growing and may hurt. You may get dark spots or blotches on your face. A dark line from your belly button to the pubic area may appear. This line is called linea nigra. Your hair may grow faster and get thicker. Health changes You may have headaches. You may have heartburn. You may pee more often. You may have swollen, bulging veins (varicose veins). You may have trouble pooping (constipation), or swollen veins in the butt that can itch or get painful (hemorrhoids). You may have back pain. This is caused by: Weight gain. Pregnancy hormones that are relaxing the joints in your pelvis. Follow these instructions at home: Medicines Talk to your health care provider if you're taking medicines. Ask if the medicines are safe to take during pregnancy. Your provider may change the medicines that you take. Do not take any medicines unless told to by your provider. Take a prenatal vitamin that has at least 600 micrograms (mcg) of folic acid. Do not use herbal medicines, illegal drugs, or medicines that are not approved by your provider. Eating and drinking While you're pregnant your body needs  extra food for your growing baby. Talk with your provider about what to eat while pregnant. Activity Most women are able to exercise during pregnancy. Exercises may need to change as your pregnancy goes on. Talk to your provider about your activities and exercise routines. Relieving pain and discomfort Wear a good, supportive bra if your breasts hurt. Rest with your legs raised if you have leg cramps or low back pain. Take warm sitz baths to soothe pain from hemorrhoids. Use hemorrhoid cream if your provider says it's okay. Do not douche. Do not use tampons or scented pads. Do not use hot tubs, steam rooms, or saunas. Safety Wear your seatbelt at all times when you're in a car. Talk to your provider if someone hits you, hurts you, or yells at you. Talk with your provider if you're feeling sad or have thoughts of hurting yourself. Lifestyle Certain things can be harmful while you're pregnant. It's best to avoid the following: Do not drink alcohol,smoke, vape, or use products with nicotine or tobacco in them. If you need help quitting, talk with your provider. Avoid cat litter boxes and soil used by cats. These things carry germs that can cause harm to your pregnancy and your baby. General instructions Keep all follow-up visits. It helps you and your unborn baby stay as healthy as possible. Write down your questions. Take them to your prenatal visits. Your provider will: Talk with you about your overall health. Give you advice or refer you to specialists who can help with different needs,  including: Prenatal education classes. Mental health and counseling. Foods and healthy eating. Ask for help if you need help with food. Where to find more information American Pregnancy Association: americanpregnancy.org [employer]ians and Gynecologists: acog.org Office on [employer] Health: TravelLesson.ca Contact a health care provider if: You have a headache that does not go away  when you take medicine. You have any of these problems: You can't eat or drink. You throw up or feel like you may throw up. You have watery poop (diarrhea) for 2 days or more. You have pain when you pee or your pee smells bad. You have been sick for 2 days or more and are not getting better. Contact your provider right away if: You have any of these coming from your vagina: Abnormal discharge. Bad-smelling fluid. Bleeding. Your baby is moving less than usual. You have contractions, belly cramping, or have pain in your pelvis or lower back. You have symptoms of high blood pressure or preeclampsia. These include: A severe, throbbing headache that does not go away. Sudden or extreme swelling of your face, hands, legs, or feet. Vision problems: You see spots. You have blurry vision. Your eyes are sensitive to light. If you can't reach the provider, go to an urgent care or emergency room. Get help right away if: You faint, become confused, or can't think clearly. You have chest pain or trouble breathing. You have any kind of injury, such as from a fall or a car crash. These symptoms may be an emergency. Call 911 right away. Do not wait to see if the symptoms will go away. Do not drive yourself to the hospital. This information is not intended to replace advice given to you by your health care provider. Make sure you discuss any questions you have with your health care provider. Document Revised: 04/23/2023 Document Reviewed: 11/21/2022 Elsevier Patient Education  2024 [employer].

## 2024-07-05 NOTE — Progress Notes (Signed)
    Return Prenatal Note   Subjective   23 y.o. G2P1001 at [redacted]w[redacted]d presents for this follow-up prenatal visit.  Patient c/o shortness of breath. She was in the ED on Sunday. She has history of asthma . She states the ED  changed her prescription of her inhaler. Which she state she is having to use several times a day when she is active.  Patient reports: Movement: Absent Contractions: Not present  Objective   Flow sheet Vitals: Pulse Rate: (!) 105 BP: 116/78 Total weight gain: 6 lb 1.6 oz (2.767 kg)  General Appearance  No acute distress, well appearing, and well nourished Pulmonary   Normal work of breathing, wheezing. osculated  Neurologic   Alert and oriented to person, place, and time Psychiatric   Mood and affect within normal limits   Assessment/Plan   Plan  23 y.o. G2P1001 at [redacted]w[redacted]d presents for follow-up OB visit. Reviewed prenatal record including previous visit note.  No problem-specific Assessment & Plan notes found for this encounter.   Unable to do pulse ox due to pt waring fake nails. Consulted Dr. Starla. Discussed follow up in ED for nebulizing treatment. Pt was on singular but has not taken for some time due to cost. Now that she has insurance with pregnancy will re order. Pt has provider PCP at [facility] that has been following her asthma . Recommend follow up appointment with them. Referral for pulmonologist offered. PT state she will follow up with PCP.    No orders of the defined types were placed in this encounter.  Return in about 4 weeks (around 08/02/2024) for rob.   Future Appointments  Date Time Provider Department Center  08/02/2024  3:00 PM [facility] US  1 [facility] None  08/03/2024  2:55 PM Dominic, Jinnie Jansky, CNM [facility] None    For next visit:  Follow up for anatomy u/s and ROB 4 weeks. ED today for SOB.      Zelda Hummer, CNM  12/02/251:48 PM

## 2024-07-21 ENCOUNTER — Telehealth

## 2024-07-22 ENCOUNTER — Other Ambulatory Visit: Payer: Self-pay | Admitting: Registered Nurse

## 2024-07-22 ENCOUNTER — Encounter: Payer: Self-pay | Admitting: Registered Nurse

## 2024-07-22 DIAGNOSIS — J4521 Mild intermittent asthma with (acute) exacerbation: Secondary | ICD-10-CM

## 2024-07-22 DIAGNOSIS — J45909 Unspecified asthma, uncomplicated: Secondary | ICD-10-CM | POA: Insufficient documentation

## 2024-07-22 MED ORDER — ALBUTEROL SULFATE HFA 108 (90 BASE) MCG/ACT IN AERS: 1.0000 | INHALATION_SPRAY | Freq: Four times a day (QID) | RESPIRATORY_TRACT | 3 refills | Status: AC | PRN

## 2024-08-01 NOTE — Progress Notes (Unsigned)
" ° ° °  Return Prenatal Note   Subjective   23 y.o. G2P1001 at [redacted]w[redacted]d presents for this follow-up prenatal visit.  Patient here with her grandmother  Patient reports: doing well, has bad headaches, happens most days, In the back of head, it feels sharp or sometimes throbbing/.squeezing improves with Tylenol  or Tylenol  PM, drinks 3-4 16 oz water bottle, is prone to HA used to get shots in the  hip or Migraine cocktail. When she touches the back of her head it hurts   Movement: Present Contractions: Not present  Objective   Flow sheet Vitals: Pulse Rate: (!) 105 BP: (!) 108/55 Fundal Height: 19 cm Fetal Heart Rate (bpm): 160 Total weight gain: 11 lb 1.6 oz (5.035 kg)  General Appearance  No acute distress, well appearing, and well nourished Pulmonary   Normal work of breathing Neurologic   Alert and oriented to person, place, and time Psychiatric   Mood and affect within normal limits   Assessment/Plan   Plan  23 y.o. G2P1001 at [redacted]w[redacted]d presents for follow-up OB visit. Reviewed prenatal record including previous visit note.  Obesity in pregnancy -TWG 11lbs, rec evaluating habits, focus on variety of fruits/veggies, lean protein and coplex carbohydrates, be physically active daily  -will need monthly growth scans starting at 28 weeks   Supervision of other normal pregnancy, antepartum -comfort measures for HA discussed and placed in AVS  -Reviewed anatomy  US -normal findings  -warning signs reviewed       No orders of the defined types were placed in this encounter.  Return in about 4 weeks (around 08/31/2024) for ROB.   Future Appointments  Date Time Provider Department Center  08/31/2024  2:55 PM Leigh Sober, MD [facility] None    For next visit:  continue with routine prenatal care     JINNIE HERO [facility], CNM  12/31/20254:18 PM  "

## 2024-08-02 ENCOUNTER — Ambulatory Visit

## 2024-08-02 DIAGNOSIS — Z3402 Encounter for supervision of normal first pregnancy, second trimester: Secondary | ICD-10-CM

## 2024-08-02 DIAGNOSIS — Z3A19 19 weeks gestation of pregnancy: Secondary | ICD-10-CM | POA: Diagnosis not present

## 2024-08-02 DIAGNOSIS — Z3400 Encounter for supervision of normal first pregnancy, unspecified trimester: Secondary | ICD-10-CM

## 2024-08-02 DIAGNOSIS — Z3A11 11 weeks gestation of pregnancy: Secondary | ICD-10-CM

## 2024-08-03 ENCOUNTER — Encounter: Payer: Self-pay | Admitting: Licensed Practical Nurse

## 2024-08-03 ENCOUNTER — Ambulatory Visit: Admitting: Licensed Practical Nurse

## 2024-08-03 VITALS — BP 108/55 | HR 105 | Wt 226.1 lb

## 2024-08-03 DIAGNOSIS — O99210 Obesity complicating pregnancy, unspecified trimester: Secondary | ICD-10-CM

## 2024-08-03 DIAGNOSIS — Z3480 Encounter for supervision of other normal pregnancy, unspecified trimester: Secondary | ICD-10-CM

## 2024-08-03 DIAGNOSIS — E669 Obesity, unspecified: Secondary | ICD-10-CM | POA: Diagnosis not present

## 2024-08-03 DIAGNOSIS — O99212 Obesity complicating pregnancy, second trimester: Secondary | ICD-10-CM

## 2024-08-03 DIAGNOSIS — Z3482 Encounter for supervision of other normal pregnancy, second trimester: Secondary | ICD-10-CM

## 2024-08-03 DIAGNOSIS — Z3A19 19 weeks gestation of pregnancy: Secondary | ICD-10-CM | POA: Diagnosis not present

## 2024-08-03 NOTE — Assessment & Plan Note (Signed)
-  comfort measures for HA discussed and placed in AVS  -Reviewed anatomy  US -normal findings  -warning signs reviewed

## 2024-08-03 NOTE — Patient Instructions (Signed)
 For Migraines:  Magnesium  500 mg daily B2 400 mg daily Coenzyme q10 150 mg daily

## 2024-08-03 NOTE — Assessment & Plan Note (Signed)
-  TWG 11lbs, rec evaluating habits, focus on variety of fruits/veggies, lean protein and coplex carbohydrates, be physically active daily  -will need monthly growth scans starting at 28 weeks

## 2024-08-06 ENCOUNTER — Inpatient Hospital Stay (HOSPITAL_COMMUNITY)
Admission: AD | Admit: 2024-08-06 | Discharge: 2024-08-06 | Disposition: A | Discharge status: Home / Self Care | Attending: Obstetrics and Gynecology | Admitting: Obstetrics and Gynecology

## 2024-08-06 ENCOUNTER — Other Ambulatory Visit: Payer: Self-pay

## 2024-08-06 DIAGNOSIS — Z3A19 19 weeks gestation of pregnancy: Secondary | ICD-10-CM | POA: Insufficient documentation

## 2024-08-06 DIAGNOSIS — K0510 Chronic gingivitis, plaque induced: Secondary | ICD-10-CM | POA: Insufficient documentation

## 2024-08-06 DIAGNOSIS — O99612 Diseases of the digestive system complicating pregnancy, second trimester: Secondary | ICD-10-CM | POA: Diagnosis not present

## 2024-08-06 DIAGNOSIS — K056 Periodontal disease, unspecified: Secondary | ICD-10-CM

## 2024-08-06 DIAGNOSIS — R6884 Jaw pain: Secondary | ICD-10-CM | POA: Diagnosis not present

## 2024-08-06 DIAGNOSIS — K0889 Other specified disorders of teeth and supporting structures: Secondary | ICD-10-CM | POA: Diagnosis present

## 2024-08-06 DIAGNOSIS — R300 Dysuria: Secondary | ICD-10-CM | POA: Diagnosis not present

## 2024-08-06 DIAGNOSIS — O26892 Other specified pregnancy related conditions, second trimester: Secondary | ICD-10-CM | POA: Insufficient documentation

## 2024-08-06 DIAGNOSIS — O219 Vomiting of pregnancy, unspecified: Secondary | ICD-10-CM | POA: Diagnosis present

## 2024-08-06 LAB — URINALYSIS, ROUTINE W REFLEX MICROSCOPIC
Bilirubin Urine: NEGATIVE
Glucose, UA: NEGATIVE mg/dL
Hgb urine dipstick: NEGATIVE
Ketones, ur: 20 mg/dL — AB
Nitrite: NEGATIVE
Protein, ur: NEGATIVE mg/dL
Specific Gravity, Urine: 1.015 (ref 1.005–1.030)
pH: 7 (ref 5.0–8.0)

## 2024-08-06 MED ORDER — AMOXICILLIN-POT CLAVULANATE 875-125 MG PO TABS: 1.0000 | ORAL_TABLET | Freq: Two times a day (BID) | ORAL | 0 refills | Status: AC

## 2024-08-06 MED ORDER — OXYCODONE-ACETAMINOPHEN 5-325 MG PO TABS: 1.0000 | ORAL_TABLET | ORAL | 0 refills | Status: AC | PRN

## 2024-08-06 NOTE — MAU Note (Signed)
 Michele Vasquez is a 24 y.o. at [redacted]w[redacted]d here in MAU reporting: seen by the dentist on 07/23/24 for vomiting that turned into gingivitis. But is now having right wisdom tooth pain that is making her entire jaw hurt. Because of the pain she feels like she cannot eat or drink and feels dehydrated. Is having headaches as well. Is not vomiting currently. Took tylenol  at 0600 this am with no relief. States the dentist will not see her because she is pregnant.  Reports burning with urination and vaginal itching at her urethra that started yesterday. Increase in urgency.   Onset of complaint: ongoing  Pain score: 8 Vitals:   08/06/24 1317  BP: 118/70  Pulse: 100  Resp: 16  Temp: 98.4 F (36.9 C)  SpO2: 98%     FHT:151 Lab orders placed from triage:  UA

## 2024-08-06 NOTE — MAU Provider Note (Signed)
" History     [id number]  Arrival date and time: 08/06/24 1147    Chief Complaint  Patient presents with   Emesis   Nausea     HPI Michele Vasquez is a 24 y.o. at [redacted]w[redacted]d who presents for tooth, right sided face pain, dysuria. She has known peridontal disease and has been to her dentist. Her dentist stated that her right lower molar needs to be removed but refused to do it or put her on antibiotics because she was pregnant. Patient states that the dentist stated that they had tried to contact her OBGYN but did not receive any communication back. She has had difficulty eating due to the pain. Patient also complains of dysuria, urinary frequency. Denies vaginal bleeding, vaginal discharge, LOF, contractions.    --/--/A POS (10/12 1611)  Past Medical History:  Diagnosis Date   Asthma    Eczema    Seasonal allergies     Past Surgical History:  Procedure Laterality Date   CHOLECYSTECTOMY     TONSILLECTOMY      Family History  Problem Relation Age of Onset   Hypertension Mother    Migraines Mother    Asthma Father    Heart disease Father    Hypertension Father    Breast cancer Maternal Grandmother    Colon cancer Maternal Grandmother    Stomach cancer Maternal Grandmother    Diabetes Maternal Grandmother     Social History   Socioeconomic History   Marital status: Single    Spouse name: Not on file   Number of children: Not on file   Years of education: Not on file   Highest education level: 10th grade  Occupational History   Not on file  Tobacco Use   Smoking status: Every Day    Current packs/day: 0.50    Types: Cigarettes   Smokeless tobacco: Never  Vaping Use   Vaping status: Never Used  Substance and Sexual Activity   Alcohol use: Yes    Comment: soc   Drug use: Not Currently    Types: Marijuana   Sexual activity: Yes    Birth control/protection: None  Other Topics Concern   Not on file  Social History Narrative   Not on file   Social  Drivers of Health   Tobacco Use: High Risk (08/03/2024)   Patient History    Smoking Tobacco Use: Every Day    Smokeless Tobacco Use: Never    Passive Exposure: Not on file  Financial Resource Strain: Medium Risk (05/16/2024)   Overall Financial Resource Strain (CARDIA)    Difficulty of Paying Living Expenses: Somewhat hard  Food Insecurity: Food Insecurity Present (05/16/2024)   Epic    Worried About Programme Researcher, Broadcasting/film/video in the Last Year: Sometimes true    Ran Out of Food in the Last Year: Sometimes true  Transportation Needs: No Transportation Needs (05/16/2024)   Epic    Lack of Transportation (Medical): No    Lack of Transportation (Non-Medical): No  Physical Activity: Insufficiently Active (05/16/2024)   Exercise Vital Sign    Days of Exercise per Week: 3 days    Minutes of Exercise per Session: 30 min  Stress: Stress Concern Present (05/16/2024)   [employer] of Occupational Health - Occupational Stress Questionnaire    Feeling of Stress: Rather much  Social Connections: Socially Isolated (05/16/2024)   Social Connection and Isolation Panel    Frequency of Communication with Friends and Family: More than three times a week  Frequency of Social Gatherings with Friends and Family: More than three times a week    Attends Religious Services: Never    Database Administrator or Organizations: No    Attends Banker Meetings: Never    Marital Status: Separated  Intimate Partner Violence: Not At Risk (05/16/2024)   Epic    Fear of Current or Ex-Partner: No    Emotionally Abused: No    Physically Abused: No    Sexually Abused: No  Depression (PHQ2-9): Low Risk (05/16/2024)   Depression (PHQ2-9)    PHQ-2 Score: 1  Alcohol Screen: Low Risk (05/16/2024)   Alcohol Screen    Last Alcohol Screening Score (AUDIT): 0  Housing: High Risk (05/16/2024)   Epic    Unable to Pay for Housing in the Last Year: No    Number of Times Moved in the Last Year: Not on file     Homeless in the Last Year: Yes  Utilities: At Risk (05/16/2024)   Epic    Threatened with loss of utilities: Yes  Health Literacy: Adequate Health Literacy (05/16/2024)   B1300 Health Literacy    Frequency of need for help with medical instructions: Never    Allergies[1]  Medications Ordered Prior to Encounter[2]  Pertinent positives and negative per HPI, all others reviewed and negative  Physical Exam   BP 118/70 (BP Location: Right Arm)   Pulse 100   Temp 98.4 F (36.9 C) (Oral)   Resp 16   Ht 5' (1.524 m)   Wt 100.8 kg   LMP 03/18/2024   SpO2 98%   BMI 43.40 kg/m   Patient Vitals for the past 24 hrs:  BP Temp Temp src Pulse Resp SpO2 Height Weight  08/06/24 1317 118/70 98.4 F (36.9 C) Oral 100 16 98 % 5' (1.524 m) 100.8 kg    Physical Exam Vitals and nursing note reviewed.  Constitutional:      Appearance: She is well-developed.  HENT:     Head: Normocephalic and atraumatic.     Mouth/Throat:     Mouth: Mucous membranes are moist.      Comments: Dental carie on left lower molar with mild erythema surrounding the tooth without evidence of abscess or drainage. No swelling of the jaw or the neck.  Eyes:     Extraocular Movements: Extraocular movements intact.  Cardiovascular:     Rate and Rhythm: Normal rate and regular rhythm.  Pulmonary:     Effort: Pulmonary effort is normal.  Abdominal:     Palpations: Abdomen is soft.     Tenderness: There is no abdominal tenderness.  Skin:    Capillary Refill: Capillary refill takes less than 2 seconds.  Neurological:     General: No focal deficit present.     Mental Status: She is alert.     Labs No results found for this or any previous visit (from the past 24 hours).  Imaging No results found.  MAU Course  Procedures  Lab Orders         Culture, OB Urine         Urinalysis, Routine w reflex microscopic -Urine, Clean Catch    Meds ordered this encounter  Medications   amoxicillin -clavulanate  (AUGMENTIN ) 875-125 MG tablet    Sig: Take 1 tablet by mouth 2 (two) times daily for 7 days.    Dispense:  14 tablet    Refill:  0   oxyCODONE -acetaminophen  (PERCOCET) 5-325 MG tablet    Sig: Take 1  tablet by mouth every 4 (four) hours as needed for severe pain (pain score 7-10).    Dispense:  20 tablet    Refill:  0   Imaging Orders  No imaging studies ordered today    MDM Moderate (Level 3-4)  Assessment and Plan  Periodontal disease  [redacted] weeks gestation of pregnancy  Dysuria   Michele Vasquez is a 24 y.o. at [redacted]w[redacted]d who presents for tooth, right sided face pain, dysuria.  -Right sided tooth pain with known periodontal disease of lower left molar.   -Encouraged patient to contact OBGYN to have letter for dental clearance be dropped in MyChart. Patient should then reschedule with dentist -Rx for 7 day course of Augmentin  sent to patient pharmacy. U/A pending. Discussed that Augmentin  would likely cover UTI as well, will follow up with urine culture to confirm.  -Short course of oxycodone  Rx provided for pain relief. -All questions answered, anticipatory guidance and detailed return precautions provided. -Stable for discharge home.         Lateef Juncaj L Sahir Tolson, MD/MHA 08/06/2024 1:38 PM  Allergies as of 08/06/2024       Reactions   Cat Dander Other (See Comments)   Asthma flares up        Medication List     TAKE these medications    albuterol  108 (90 Base) MCG/ACT inhaler Commonly known as: VENTOLIN  HFA Inhale 1-2 puffs into the lungs every 6 (six) hours as needed. What changed: reasons to take this   amoxicillin -clavulanate 875-125 MG tablet Commonly known as: AUGMENTIN  Take 1 tablet by mouth 2 (two) times daily for 7 days.   aspirin  81 MG chewable tablet Chew 1 tablet (81 mg total) by mouth daily.   AZO-CRANBERRY PO Take by mouth.   budesonide  180 MCG/ACT inhaler Commonly known as: PULMICORT  Inhale 2 puffs into the lungs in the morning and  at bedtime.   cetirizine 10 MG tablet Commonly known as: ZYRTEC Take 10 mg by mouth daily.   cyclobenzaprine  10 MG tablet Commonly known as: FLEXERIL  Take 1 tablet (10 mg total) by mouth 2 (two) times daily as needed for muscle spasms.   famotidine  20 MG tablet Commonly known as: PEPCID  Take 20 mg by mouth.   fluticasone  50 MCG/ACT nasal spray Commonly known as: FLONASE  Place 2 sprays into both nostrils daily.   hydrOXYzine  25 MG tablet Commonly known as: ATARAX  Take 0.5-1 tablets (12.5-25 mg total) by mouth every 8 (eight) hours as needed for itching.   lidocaine  5 % Commonly known as: Lidoderm  Place 1 patch onto the skin daily as needed. Apply patch to area most significant pain once per day.  Remove and discard patch within 12 hours of application.   loratadine  10 MG tablet Commonly known as: CLARITIN  Take 1 tablet (10 mg total) by mouth daily.   metoCLOPramide  10 MG tablet Commonly known as: REGLAN  Take 1 tablet (10 mg total) by mouth every 6 (six) hours.   montelukast  10 MG tablet Commonly known as: Singulair  Take 1 tablet (10 mg total) by mouth at bedtime.   ondansetron  4 MG tablet Commonly known as: ZOFRAN  Take 1 tablet (4 mg total) by mouth every 6 (six) hours.   ondansetron  8 MG disintegrating tablet Commonly known as: ZOFRAN -ODT Take 1 tablet (8 mg total) by mouth every 8 (eight) hours as needed.   oxyCODONE -acetaminophen  5-325 MG tablet Commonly known as: Percocet Take 1 tablet by mouth every 4 (four) hours as needed for severe pain (pain score 7-10).  promethazine  25 MG tablet Commonly known as: PHENERGAN  Take 1 tablet (25 mg total) by mouth every 6 (six) hours as needed for nausea or vomiting.   SUMAtriptan 50 MG tablet Commonly known as: IMITREX SMARTSIG:1 Tablet(s) By Mouth 1-2 Times Daily   triamcinolone  0.025 % ointment Commonly known as: KENALOG  Apply 1 Application topically 2 (two) times daily.           [1]  Allergies Allergen  Reactions   Cat Dander Other (See Comments)    Asthma flares up  [2]  No current facility-administered medications on file prior to encounter.   Current Outpatient Medications on File Prior to Encounter  Medication Sig Dispense Refill   albuterol  (VENTOLIN  HFA) 108 (90 Base) MCG/ACT inhaler Inhale 1-2 puffs into the lungs every 6 (six) hours as needed. (Patient taking differently: Inhale 1-2 puffs into the lungs every 6 (six) hours as needed for shortness of breath or wheezing.) 1 each 3   cetirizine (ZYRTEC) 10 MG tablet Take 10 mg by mouth daily.     fluticasone  (FLONASE ) 50 MCG/ACT nasal spray Place 2 sprays into both nostrils daily. 16 g 0   loratadine  (CLARITIN ) 10 MG tablet Take 1 tablet (10 mg total) by mouth daily. 30 tablet 0   metoCLOPramide  (REGLAN ) 10 MG tablet Take 1 tablet (10 mg total) by mouth every 6 (six) hours. 30 tablet 2   montelukast  (SINGULAIR ) 10 MG tablet Take 1 tablet (10 mg total) by mouth at bedtime. 30 tablet 5   ondansetron  (ZOFRAN ) 4 MG tablet Take 1 tablet (4 mg total) by mouth every 6 (six) hours. 30 tablet 0   ondansetron  (ZOFRAN -ODT) 8 MG disintegrating tablet Take 1 tablet (8 mg total) by mouth every 8 (eight) hours as needed. 30 tablet 2   aspirin  81 MG chewable tablet Chew 1 tablet (81 mg total) by mouth daily. 90 tablet 3   AZO-CRANBERRY PO Take by mouth.     budesonide  (PULMICORT ) 180 MCG/ACT inhaler Inhale 2 puffs into the lungs in the morning and at bedtime. 1 each 0   cyclobenzaprine  (FLEXERIL ) 10 MG tablet Take 1 tablet (10 mg total) by mouth 2 (two) times daily as needed for muscle spasms. 20 tablet 0   famotidine  (PEPCID ) 20 MG tablet Take 20 mg by mouth.     hydrOXYzine  (ATARAX ) 25 MG tablet Take 0.5-1 tablets (12.5-25 mg total) by mouth every 8 (eight) hours as needed for itching. 30 tablet 0   lidocaine  (LIDODERM ) 5 % Place 1 patch onto the skin daily as needed. Apply patch to area most significant pain once per day.  Remove and discard patch  within 12 hours of application. 15 patch 0   promethazine  (PHENERGAN ) 25 MG tablet Take 1 tablet (25 mg total) by mouth every 6 (six) hours as needed for nausea or vomiting. 30 tablet 2   SUMAtriptan (IMITREX) 50 MG tablet SMARTSIG:1 Tablet(s) By Mouth 1-2 Times Daily     triamcinolone  (KENALOG ) 0.025 % ointment Apply 1 Application topically 2 (two) times daily. 30 g 0   [DISCONTINUED] dicyclomine  (BENTYL ) 20 MG tablet Take 1 tablet (20 mg total) by mouth 4 (four) times daily -  before meals and at bedtime. 20 tablet 0   [DISCONTINUED] esomeprazole  (NEXIUM ) 40 MG capsule Take 1 capsule (40 mg total) by mouth daily. 30 capsule 1   [DISCONTINUED] omeprazole  (PRILOSEC) 20 MG capsule Take 1 capsule (20 mg total) by mouth 2 (two) times daily before a meal for 15 days. 30 capsule  0  ° °"

## 2024-08-07 LAB — CULTURE, OB URINE

## 2024-08-08 ENCOUNTER — Other Ambulatory Visit: Payer: Self-pay | Admitting: Obstetrics & Gynecology

## 2024-08-08 DIAGNOSIS — Z362 Encounter for other antenatal screening follow-up: Secondary | ICD-10-CM

## 2024-08-08 DIAGNOSIS — Z3480 Encounter for supervision of other normal pregnancy, unspecified trimester: Secondary | ICD-10-CM

## 2024-08-08 DIAGNOSIS — O99210 Obesity complicating pregnancy, unspecified trimester: Secondary | ICD-10-CM

## 2024-08-21 ENCOUNTER — Telehealth: Admitting: Physician Assistant

## 2024-08-21 DIAGNOSIS — Z760 Encounter for issue of repeat prescription: Secondary | ICD-10-CM | POA: Diagnosis not present

## 2024-08-21 DIAGNOSIS — J45909 Unspecified asthma, uncomplicated: Secondary | ICD-10-CM

## 2024-08-21 DIAGNOSIS — J101 Influenza due to other identified influenza virus with other respiratory manifestations: Secondary | ICD-10-CM | POA: Diagnosis not present

## 2024-08-21 MED ORDER — ALBUTEROL SULFATE HFA 108 (90 BASE) MCG/ACT IN AERS: 2.0000 | INHALATION_SPRAY | Freq: Four times a day (QID) | RESPIRATORY_TRACT | 0 refills | Status: AC | PRN

## 2024-08-21 MED ORDER — OSELTAMIVIR PHOSPHATE 75 MG PO CAPS: 75.0000 mg | ORAL_CAPSULE | Freq: Two times a day (BID) | ORAL | 0 refills | Status: AC

## 2024-08-21 NOTE — Patient Instructions (Addendum)
 " Favour Marry Vasquez, thank you for joining Lamondre Wesche, PA-C for today's virtual visit.  While this provider is not your primary care provider (PCP), if your PCP is located in our provider database this encounter information will be shared with them immediately following your visit.   A [facility] MyChart account gives you access to today's visit and all your visits, tests, and labs performed at [facility]  click here if you don't have a [facility] MyChart account or go to [email or web address]  Consent: (Patient) Michele Vasquez provided verbal consent for this virtual visit at the beginning of the encounter.  Current Medications:  Current Outpatient Medications:    albuterol  (VENTOLIN  HFA) 108 (90 Base) MCG/ACT inhaler, Inhale 1-2 puffs into the lungs every 6 (six) hours as needed. (Patient taking differently: Inhale 1-2 puffs into the lungs every 6 (six) hours as needed for shortness of breath or wheezing.), Disp: 1 each, Rfl: 3   aspirin  81 MG chewable tablet, Chew 1 tablet (81 mg total) by mouth daily., Disp: 90 tablet, Rfl: 3   AZO-CRANBERRY PO, Take by mouth., Disp: , Rfl:    budesonide  (PULMICORT ) 180 MCG/ACT inhaler, Inhale 2 puffs into the lungs in the morning and at bedtime., Disp: 1 each, Rfl: 0   cetirizine (ZYRTEC) 10 MG tablet, Take 10 mg by mouth daily., Disp: , Rfl:    cyclobenzaprine  (FLEXERIL ) 10 MG tablet, Take 1 tablet (10 mg total) by mouth 2 (two) times daily as needed for muscle spasms., Disp: 20 tablet, Rfl: 0   famotidine  (PEPCID ) 20 MG tablet, Take 20 mg by mouth., Disp: , Rfl:    fluticasone  (FLONASE ) 50 MCG/ACT nasal spray, Place 2 sprays into both nostrils daily., Disp: 16 g, Rfl: 0   hydrOXYzine  (ATARAX ) 25 MG tablet, Take 0.5-1 tablets (12.5-25 mg total) by mouth every 8 (eight) hours as needed for itching., Disp: 30 tablet, Rfl: 0   lidocaine  (LIDODERM ) 5 %, Place 1 patch onto the skin daily as needed. Apply patch to area most  significant pain once per day.  Remove and discard patch within 12 hours of application., Disp: 15 patch, Rfl: 0   loratadine  (CLARITIN ) 10 MG tablet, Take 1 tablet (10 mg total) by mouth daily., Disp: 30 tablet, Rfl: 0   metoCLOPramide  (REGLAN ) 10 MG tablet, Take 1 tablet (10 mg total) by mouth every 6 (six) hours., Disp: 30 tablet, Rfl: 2   montelukast  (SINGULAIR ) 10 MG tablet, Take 1 tablet (10 mg total) by mouth at bedtime., Disp: 30 tablet, Rfl: 5   ondansetron  (ZOFRAN ) 4 MG tablet, Take 1 tablet (4 mg total) by mouth every 6 (six) hours., Disp: 30 tablet, Rfl: 0   ondansetron  (ZOFRAN -ODT) 8 MG disintegrating tablet, Take 1 tablet (8 mg total) by mouth every 8 (eight) hours as needed., Disp: 30 tablet, Rfl: 2   oxyCODONE -acetaminophen  (PERCOCET) 5-325 MG tablet, Take 1 tablet by mouth every 4 (four) hours as needed for severe pain (pain score 7-10)., Disp: 20 tablet, Rfl: 0   promethazine  (PHENERGAN ) 25 MG tablet, Take 1 tablet (25 mg total) by mouth every 6 (six) hours as needed for nausea or vomiting., Disp: 30 tablet, Rfl: 2   SUMAtriptan (IMITREX) 50 MG tablet, SMARTSIG:1 Tablet(s) By Mouth 1-2 Times Daily, Disp: , Rfl:    triamcinolone  (KENALOG ) 0.025 % ointment, Apply 1 Application topically 2 (two) times daily., Disp: 30 g, Rfl: 0   Medications ordered in this encounter:  No orders of the defined types were placed  in this encounter.    *If you need refills on other medications prior to your next appointment, please contact your pharmacy*  Follow-Up: Call back or seek an in-person evaluation if the symptoms worsen or if the condition fails to improve as anticipated.  [facility] [phone]  Other Instructions  Get rest and adequate sleep  Drink plenty of water, broth, and other clear fluids to stay hydrated.  Use a cool-mist humidifier  Elevate the head of the bed to help with post nasal drainage Sip warm liquids, gargle with salt water, use lozenges, or suck  on hard candy.  Use over-the-counter medications like acetaminophen  (Tylenol ) or ibuprofen  (Advil , Motrin ) as needed for fever and pain Honey cough drops can help alleviate cough symptoms.  Use saline nasal sprays or washes.  Over the counter mucinex , max strength ( blue and white box) to help loosen sinus congestion.  Please to the emergency room if any new or worsening symptoms  Please seek an in-person evaluation if the symptoms worsen or if the condition fails to improve as anticipated.  PCP follow-up in 5-7 days    If you have been instructed to have an in-person evaluation today at a local Urgent Care facility, please use the link below. It will take you to a list of all of our available [facility], including address, phone number and hours of operation. Please do not delay care.  [facility]  If you or a family member do not have a primary care provider, use the link below to schedule a visit and establish care. When you choose a [facility] primary care physician or advanced practice provider, you gain a long-term partner in health. Find a Primary Care Provider  Learn more about [facility] in-office and virtual care options: [facility] Get Care Now  "

## 2024-08-21 NOTE — Progress Notes (Signed)
 " Virtual Visit Consent   Michele Vasquez, you are scheduled for a virtual visit with a [facility] provider today. Just as with appointments in the office, your consent must be obtained to participate. Your consent will be active for this visit and any virtual visit you may have with one of our providers in the next 365 days. If you have a MyChart account, a copy of this consent can be sent to you electronically.  As this is a virtual visit, video technology does not allow for your provider to perform a traditional examination. This may limit your provider's ability to fully assess your condition. If your provider identifies any concerns that need to be evaluated in person or the need to arrange testing (such as labs, EKG, etc.), we will make arrangements to do so. Although advances in technology are sophisticated, we cannot ensure that it will always work on either your end or our end. If the connection with a video visit is poor, the visit may have to be switched to a telephone visit. With either a video or telephone visit, we are not always able to ensure that we have a secure connection.  By engaging in this virtual visit, you consent to the provision of healthcare and authorize for your insurance to be billed (if applicable) for the services provided during this visit. Depending on your insurance coverage, you may receive a charge related to this service.  I need to obtain your verbal consent now. Are you willing to proceed with your visit today? Michele Vasquez has provided verbal consent on 08/21/2024 for a virtual visit (video or telephone). Gearl Kimbrough, PA-C  Date: 08/21/2024 3:02 PM   Virtual Visit via Video Note   I, Michele Vasquez, connected with  Michele Vasquez  ([id number], 07-16-2001) on 08/21/24 at  2:45 PM EST by a video-enabled telemedicine application and verified that I am speaking with the correct person using two identifiers.  Location: Patient:  Virtual Visit Location Patient: Home Provider: Virtual Visit Location Provider: Home Office   I discussed the limitations of evaluation and management by telemedicine and the availability of in person appointments. The patient expressed understanding and agreed to proceed.    History of Present Illness: Michele Vasquez is a 24 y.o. who identifies as a female who was assigned female at birth, and is being seen today for cough, sweats.  HPI: Patient presents for evaluation of flulike symptoms for the past 2 days.  Reports trouble sleeping at night, bodyaches, fatigue and cough.  She took an at-home flu test last night and was positive for flu A.  Reports no sick contacts.  Associated with shortness of breath and wheezing intermittently relieved by her inhaler.  She does report history of asthma.  Denies any chest pain at this time, fever, nausea, vomiting.  Patient is also requesting a refill for her ProAir  inhaler.      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  Ambulatory within the house during the video call, no dyspnea observed over video Head is normocephalic, atraumatic.  No labored breathing.  Speech is clear and coherent  with logical content.  Patient is alert and oriented at baseline.    Assessment and Plan: 1. Influenza A (Primary) - oseltamivir  (TAMIFLU ) 75 MG capsule; Take 1 capsule (75 mg total) by mouth 2 (two) times daily for 5 days.  Dispense: 10 capsule; Refill: 0  2. Uncomplicated asthma, unspecified asthma severity, unspecified  whether persistent - albuterol  (VENTOLIN  HFA) 108 (90 Base) MCG/ACT inhaler; Inhale 2 puffs into the lungs every 6 (six) hours as needed for wheezing or shortness of breath.  Dispense: 8 g; Refill: 0  3. Encounter for medication refill - albuterol  (VENTOLIN  HFA) 108 (90 Base) MCG/ACT inhaler; Inhale 2 puffs into the lungs every 6 (six) hours as needed for wheezing or shortness of breath.  Dispense: 8 g; Refill: 0   Get rest and adequate sleep  Drink plenty of water, broth, and other clear fluids to stay hydrated.  Use a cool-mist humidifier  Elevate the head of the bed to help with post nasal drainage Sip warm liquids, gargle with salt water, use lozenges, or suck on hard candy.  Use over-the-counter medications like acetaminophen  (Tylenol ) or ibuprofen  (Advil , Motrin ) as needed for fever and pain Honey cough drops can help alleviate cough symptoms.  Use saline nasal sprays or washes.  Over the counter mucinex , max strength ( blue and white box) to help loosen sinus congestion.  Please to the emergency room if any new or worsening symptoms  Please seek an in-person evaluation if the symptoms worsen or if the condition fails to improve as anticipated.  PCP follow-up in 5-7 days   Follow Up Instructions: I discussed the assessment and treatment plan with the patient. The patient was provided an opportunity to ask questions and all were answered. The patient agreed with the plan and demonstrated an understanding of the instructions.  A copy of instructions were sent to the patient via MyChart unless otherwise noted below.    The patient was advised to call back or seek an in-person evaluation if the symptoms worsen or if the condition fails to improve as anticipated.    Michele Piazza, PA-C     [1]  Allergies Allergen Reactions   Cat Dander Other (See Comments)    Asthma flares up  [2]  Current Outpatient Medications:    albuterol  (VENTOLIN  HFA) 108 (90 Base) MCG/ACT inhaler,  Inhale 2 puffs into the lungs every 6 (six) hours as needed for wheezing or shortness of breath., Disp: 8 g, Rfl: 0   oseltamivir  (TAMIFLU ) 75 MG capsule, Take 1 capsule (75 mg total) by mouth 2 (two) times daily for 5 days., Disp: 10 capsule, Rfl: 0   albuterol  (VENTOLIN  HFA) 108 (90 Base) MCG/ACT inhaler, Inhale 1-2 puffs into the lungs every 6 (six) hours as needed. (Patient taking differently: Inhale 1-2 puffs into the lungs every 6 (six) hours as needed for shortness of breath or wheezing.), Disp: 1 each, Rfl: 3   aspirin  81 MG chewable tablet, Chew 1 tablet (81 mg total) by mouth daily., Disp: 90 tablet, Rfl: 3   AZO-CRANBERRY PO, Take by mouth., Disp: , Rfl:    budesonide  (PULMICORT ) 180 MCG/ACT inhaler, Inhale 2 puffs into the lungs in the morning and at bedtime., Disp: 1 each, Rfl: 0   cetirizine (ZYRTEC) 10 MG tablet, Take 10 mg by mouth daily., Disp: , Rfl:    cyclobenzaprine  (FLEXERIL ) 10 MG tablet, Take 1 tablet (10 mg total) by mouth 2 (two) times daily as needed for muscle spasms., Disp: 20 tablet, Rfl: 0  famotidine  (PEPCID ) 20 MG tablet, Take 20 mg by mouth., Disp: , Rfl:    fluticasone  (FLONASE ) 50 MCG/ACT nasal spray, Place 2 sprays into both nostrils daily., Disp: 16 g, Rfl: 0   hydrOXYzine  (ATARAX ) 25 MG tablet, Take 0.5-1 tablets (12.5-25 mg total) by mouth every 8 (eight) hours as needed for itching., Disp: 30 tablet, Rfl: 0   lidocaine  (LIDODERM ) 5 %, Place 1 patch onto the skin daily as needed. Apply patch to area most significant pain once per day.  Remove and discard patch within 12 hours of application., Disp: 15 patch, Rfl: 0   loratadine  (CLARITIN ) 10 MG tablet, Take 1 tablet (10 mg total) by mouth daily., Disp: 30 tablet, Rfl: 0   metoCLOPramide  (REGLAN ) 10 MG tablet, Take 1 tablet (10 mg total) by mouth every 6 (six) hours., Disp: 30 tablet, Rfl: 2   montelukast  (SINGULAIR ) 10 MG tablet, Take 1 tablet (10 mg total) by mouth at bedtime., Disp: 30 tablet, Rfl: 5    ondansetron  (ZOFRAN ) 4 MG tablet, Take 1 tablet (4 mg total) by mouth every 6 (six) hours., Disp: 30 tablet, Rfl: 0   ondansetron  (ZOFRAN -ODT) 8 MG disintegrating tablet, Take 1 tablet (8 mg total) by mouth every 8 (eight) hours as needed., Disp: 30 tablet, Rfl: 2   oxyCODONE -acetaminophen  (PERCOCET) 5-325 MG tablet, Take 1 tablet by mouth every 4 (four) hours as needed for severe pain (pain score 7-10)., Disp: 20 tablet, Rfl: 0   promethazine  (PHENERGAN ) 25 MG tablet, Take 1 tablet (25 mg total) by mouth every 6 (six) hours as needed for nausea or vomiting., Disp: 30 tablet, Rfl: 2   SUMAtriptan (IMITREX) 50 MG tablet, SMARTSIG:1 Tablet(s) By Mouth 1-2 Times Daily, Disp: , Rfl:    triamcinolone  (KENALOG ) 0.025 % ointment, Apply 1 Application topically 2 (two) times daily., Disp: 30 g, Rfl: 0  "

## 2024-08-31 ENCOUNTER — Other Ambulatory Visit

## 2024-08-31 ENCOUNTER — Encounter: Admitting: Obstetrics

## 2024-09-01 NOTE — Addendum Note (Signed)
 Addended by: TAFT CAMELIA MATSU on: 09/01/2024 10:38 AM   Modules accepted: Orders

## 2024-09-01 NOTE — Progress Notes (Signed)
 Order changed from MFM OB Complete(incorrect order) to in office scan. Patient was only 18weeks at initial anatomy. Dr Ozan recommended f/u scan, but did not specify by MFM. [facility] approved to re-scan and will send to MFM if heart views still not complete.

## 2024-09-12 ENCOUNTER — Other Ambulatory Visit

## 2024-09-12 ENCOUNTER — Encounter: Admitting: Obstetrics & Gynecology
